# Patient Record
Sex: Male | Born: 1970 | Race: Black or African American | Hispanic: No | Marital: Married | Smoking: Never smoker
Health system: Southern US, Community
[De-identification: ages and names within clinical notes are randomized; demographics above are authoritative.]

## PROBLEM LIST (undated history)

## (undated) DIAGNOSIS — R42 Dizziness and giddiness: Secondary | ICD-10-CM

## (undated) DIAGNOSIS — E079 Disorder of thyroid, unspecified: Secondary | ICD-10-CM

## (undated) DIAGNOSIS — E0590 Thyrotoxicosis, unspecified without thyrotoxic crisis or storm: Secondary | ICD-10-CM

## (undated) DIAGNOSIS — I10 Essential (primary) hypertension: Secondary | ICD-10-CM

## (undated) HISTORY — DX: Disorder of thyroid, unspecified: E07.9

## (undated) HISTORY — DX: Essential (primary) hypertension: I10

## (undated) HISTORY — DX: Thyrotoxicosis, unspecified without thyrotoxic crisis or storm: E05.90

## (undated) HISTORY — DX: Dizziness and giddiness: R42

---

## 2001-09-20 ENCOUNTER — Encounter: Admission: RE | Admit: 2001-09-20 | Discharge: 2001-09-20 | Payer: Self-pay | Admitting: Family Medicine

## 2001-10-03 ENCOUNTER — Encounter: Admission: RE | Admit: 2001-10-03 | Discharge: 2001-10-03 | Payer: Self-pay | Admitting: Family Medicine

## 2002-06-25 ENCOUNTER — Encounter: Admission: RE | Admit: 2002-06-25 | Discharge: 2002-06-25 | Payer: Self-pay | Admitting: Family Medicine

## 2002-09-12 ENCOUNTER — Encounter: Admission: RE | Admit: 2002-09-12 | Discharge: 2002-09-12 | Payer: Self-pay | Admitting: Family Medicine

## 2003-02-20 ENCOUNTER — Encounter: Admission: RE | Admit: 2003-02-20 | Discharge: 2003-02-20 | Payer: Self-pay | Admitting: Family Medicine

## 2003-05-08 ENCOUNTER — Encounter: Admission: RE | Admit: 2003-05-08 | Discharge: 2003-05-08 | Payer: Self-pay | Admitting: Family Medicine

## 2004-09-25 ENCOUNTER — Ambulatory Visit: Payer: Self-pay | Admitting: Family Medicine

## 2006-09-21 ENCOUNTER — Encounter (INDEPENDENT_AMBULATORY_CARE_PROVIDER_SITE_OTHER): Payer: Self-pay | Admitting: *Deleted

## 2006-09-21 ENCOUNTER — Ambulatory Visit: Payer: Self-pay | Admitting: Family Medicine

## 2006-09-21 LAB — CONVERTED CEMR LAB: Rapid Strep: NEGATIVE

## 2006-11-08 ENCOUNTER — Ambulatory Visit: Payer: Self-pay | Admitting: Family Medicine

## 2006-11-10 ENCOUNTER — Ambulatory Visit: Payer: Self-pay | Admitting: Sports Medicine

## 2006-12-15 ENCOUNTER — Ambulatory Visit: Payer: Self-pay | Admitting: Family Medicine

## 2007-02-02 ENCOUNTER — Ambulatory Visit: Payer: Self-pay | Admitting: Family Medicine

## 2007-02-02 ENCOUNTER — Encounter: Payer: Self-pay | Admitting: Family Medicine

## 2007-02-02 DIAGNOSIS — E669 Obesity, unspecified: Secondary | ICD-10-CM

## 2007-02-02 LAB — CONVERTED CEMR LAB
Glucose, Urine, Semiquant: NEGATIVE
Potassium: 4.2 meq/L (ref 3.5–5.3)
Sodium: 140 meq/L (ref 135–145)

## 2007-02-03 ENCOUNTER — Encounter: Payer: Self-pay | Admitting: Family Medicine

## 2007-02-24 ENCOUNTER — Emergency Department (HOSPITAL_COMMUNITY): Admission: EM | Admit: 2007-02-24 | Discharge: 2007-02-24 | Payer: Self-pay | Admitting: Emergency Medicine

## 2007-02-28 ENCOUNTER — Ambulatory Visit: Payer: Self-pay | Admitting: Family Medicine

## 2007-05-30 ENCOUNTER — Ambulatory Visit: Payer: Self-pay | Admitting: Family Medicine

## 2007-05-30 ENCOUNTER — Encounter (INDEPENDENT_AMBULATORY_CARE_PROVIDER_SITE_OTHER): Payer: Self-pay | Admitting: Family Medicine

## 2007-05-30 DIAGNOSIS — I10 Essential (primary) hypertension: Secondary | ICD-10-CM

## 2007-05-30 LAB — CONVERTED CEMR LAB
CO2: 28 meq/L (ref 19–32)
Chloride: 103 meq/L (ref 96–112)
Glucose, Bld: 90 mg/dL (ref 70–99)
LDL Cholesterol: 156 mg/dL — ABNORMAL HIGH (ref 0–99)
Potassium: 4.8 meq/L (ref 3.5–5.3)
Sodium: 139 meq/L (ref 135–145)
Total CHOL/HDL Ratio: 4
VLDL: 20 mg/dL (ref 0–40)

## 2007-06-01 ENCOUNTER — Encounter (INDEPENDENT_AMBULATORY_CARE_PROVIDER_SITE_OTHER): Payer: Self-pay | Admitting: Family Medicine

## 2007-07-17 ENCOUNTER — Encounter: Payer: Self-pay | Admitting: *Deleted

## 2007-09-17 ENCOUNTER — Encounter (INDEPENDENT_AMBULATORY_CARE_PROVIDER_SITE_OTHER): Payer: Self-pay | Admitting: *Deleted

## 2008-05-31 ENCOUNTER — Encounter: Payer: Self-pay | Admitting: Sports Medicine

## 2008-05-31 ENCOUNTER — Ambulatory Visit: Payer: Self-pay | Admitting: Sports Medicine

## 2008-05-31 ENCOUNTER — Encounter (INDEPENDENT_AMBULATORY_CARE_PROVIDER_SITE_OTHER): Payer: Self-pay | Admitting: *Deleted

## 2008-06-03 ENCOUNTER — Encounter (INDEPENDENT_AMBULATORY_CARE_PROVIDER_SITE_OTHER): Payer: Self-pay | Admitting: *Deleted

## 2008-06-03 ENCOUNTER — Ambulatory Visit: Payer: Self-pay | Admitting: Family Medicine

## 2008-07-31 ENCOUNTER — Ambulatory Visit: Payer: Self-pay | Admitting: Sports Medicine

## 2009-04-09 ENCOUNTER — Ambulatory Visit: Payer: Self-pay | Admitting: Sports Medicine

## 2009-04-11 ENCOUNTER — Ambulatory Visit: Payer: Self-pay | Admitting: Family Medicine

## 2009-04-11 ENCOUNTER — Encounter: Payer: Self-pay | Admitting: Sports Medicine

## 2009-04-11 LAB — CONVERTED CEMR LAB
Albumin: 4.1 g/dL (ref 3.5–5.2)
Alkaline Phosphatase: 56 units/L (ref 39–117)
CO2: 28 meq/L (ref 19–32)
Glucose, Bld: 86 mg/dL (ref 70–99)
LDL Cholesterol: 150 mg/dL — ABNORMAL HIGH (ref 0–99)
MCHC: 33.9 g/dL (ref 30.0–36.0)
Potassium: 4.6 meq/L (ref 3.5–5.3)
RBC: 4.96 M/uL (ref 4.22–5.81)
Sodium: 139 meq/L (ref 135–145)
Total Protein: 6.9 g/dL (ref 6.0–8.3)
Triglycerides: 66 mg/dL (ref <150)
WBC: 5.7 10*3/uL (ref 4.0–10.5)

## 2009-10-09 ENCOUNTER — Ambulatory Visit: Payer: Self-pay | Admitting: Sports Medicine

## 2009-10-14 ENCOUNTER — Encounter: Payer: Self-pay | Admitting: Sports Medicine

## 2009-10-20 ENCOUNTER — Encounter (INDEPENDENT_AMBULATORY_CARE_PROVIDER_SITE_OTHER): Payer: Self-pay | Admitting: *Deleted

## 2009-10-20 ENCOUNTER — Ambulatory Visit: Payer: Self-pay | Admitting: Sports Medicine

## 2009-10-27 ENCOUNTER — Encounter (INDEPENDENT_AMBULATORY_CARE_PROVIDER_SITE_OTHER): Payer: Self-pay | Admitting: *Deleted

## 2009-10-30 ENCOUNTER — Ambulatory Visit: Payer: Self-pay | Admitting: Sports Medicine

## 2009-10-31 ENCOUNTER — Encounter: Payer: Self-pay | Admitting: Sports Medicine

## 2009-11-04 ENCOUNTER — Encounter: Payer: Self-pay | Admitting: Sports Medicine

## 2009-11-06 ENCOUNTER — Encounter: Payer: Self-pay | Admitting: Sports Medicine

## 2009-12-04 ENCOUNTER — Encounter (INDEPENDENT_AMBULATORY_CARE_PROVIDER_SITE_OTHER): Payer: Self-pay | Admitting: *Deleted

## 2010-06-01 ENCOUNTER — Encounter: Payer: Self-pay | Admitting: Family Medicine

## 2010-08-20 NOTE — Assessment & Plan Note (Signed)
Summary: 11:30- FINGER PAIN PER NEETON,MC   Vital Signs:  Patient profile:   40 year old male BP sitting:   157 / 97  Vitals Entered By: Lillia Pauls CMA (October 20, 2009 12:47 PM)  Primary Provider:  Tinnie Gens MD   History of Present Illness: Crecencio was playing football in [employer] on 4/3 grabbed [place] w ring finger left hand felt as if finger popped out of place pulled on PIP and this seemed to pop howver had persistent pain on volar surface of DIP of 4th finger and some over dorsal some bruising noted  seen at urgent center splinted xray neg concern for [place] finger and asked to dollow up here  Allergies: No Known Drug Allergies  Physical Exam  General:  Well-developed,well-nourished,in no acute distress; alert,appropriate and cooperative throughout examination Msk:  left 4th finger is swollen and DIP is in slight flexion tender on volar surface slt tender on dorsum pip not tender no rotation or angulation slt swelling over mid phalanx volar  MSK [place] dorsal area does not reveal a tear or fragment over dorsal DIP to suggest mallet finger Volar surface shows an intact flex profundis tendon attaches to distal phalanx but w some swelling and ? of partial tear small fx type fragment at medial side   Impression & Recommendations:  Problem # 1:  SPRAIN AND STRAIN OF INTERPHALANGEAL OF HAND (QMV-784.69)  concern for [place] finger but I thik he has some active flexion and scan is not suggestive   on scan I do not see a complete tear in fact there may be a small avulsion fx at DIP volar  keep splinted reck 1 wk  will discuss w ortho to be sure whether cons approach OK  Orders: [place] LIMITED ([id number])  Complete Medication List: 1)  Lisinopril-hydrochlorothiazide 20-12.5 Mg Tabs (Lisinopril-hydrochlorothiazide) .... 2 by mouth qd 2)  Diclofenac Sodium 75 Mg Tbec (Diclofenac sodium) .Marland Kitchen.. 1 by mouth two times a day prn 3)  Antivert 25 Mg Tabs (Meclizine hcl) .... One tab  by mouth three times a day

## 2010-08-20 NOTE — Miscellaneous (Signed)
  Clinical Lists Changes  Problems: Removed problem of SPRAIN AND STRAIN OF INTERPHALANGEAL OF HAND (ICD-842.13) Removed problem of TESTICULAR PAIN, LEFT (ICD-608.9) Removed problem of FATIGUE (ICD-780.79) Removed problem of BENIGN POSITIONAL VERTIGO (ICD-386.11) Removed problem of MUSCLE STRAIN, RIGHT PECTORALIS MUSCLE (ICD-848.8) Removed problem of WOUND OPEN, SCALP W/O COMPLICATION (ICD-873.0) Removed problem of EXAMINATION, ROUTINE MEDICAL (ICD-V70.0) Removed problem of ELEVATED BLOOD PRESSURE WITHOUT DIAGNOSIS OF HYPERTENSION (ICD-796.2) Removed problem of SYMPTOM, COUGH (ICD-786.2) Removed problem of FAMILY HISTORY DIABETES 1ST DEGREE RELATIVE (ICD-V18.0)

## 2010-08-20 NOTE — Letter (Signed)
Summary: Frank Vargas   Imported By: Marily Memos 10/31/2009 10:53:43  _____________________________________________________________________  External Attachment:    Type:   Image     Comment:   External Document

## 2010-08-20 NOTE — Letter (Signed)
Summary: Out of Work  [facility]  [address]   [place], [place] [address]   Phone: [phone]  Fax: [phone]    October 27, 2009   Employee:  FARREL GUIMOND     To Whom It May Concern:   For Medical reasons, please excuse the above named employee from work starting October 27, 2009 until December 22, 2009 or until notified sooner. If you need additional information, please feel free to contact our office.         Sincerely,    Sibyl Parr. Fields, M.D./Annelisa Ryback Christell Constant CMA

## 2010-08-20 NOTE — Assessment & Plan Note (Signed)
Summary: 1:30-GROIN CHECK,MC   Primary Provider:  Tinnie Gens MD   History of Present Illness: Developed left testicular pain 7 days ago he was running in loose shorts and did not wear support briefs that he usually wears no real swelling but tender to touch left testicle for 3 days pain now resolved  no DC denies STD risk or exposure  no strain of mm in area  does worry as father has hx of prostate cancer  Allergies: No Known Drug Allergies  Physical Exam  General:  Well-developed,well-nourished,in no acute distress; alert,appropriate and cooperative throughout examination Genitalia:  normal penis no lesions or dischargeno hydrocele, no varicocele, no scrotal masses, no testicular masses or atrophy, and no cutaneous lesions.   Msk:  norm Hip ROM no pain on testing of adductors and otehr mm   Impression & Recommendations:  Problem # 1:  TESTICULAR PAIN, LEFT (ICD-608.9) I suspect this arose from jogging without support and contusion  no sign of infection or masses  suggest support w jock over shorts when running  RTC if pain or any swelling recurs  Problem # 2:  HYPERTENSION, BENIGN (ICD-401.1)  His updated medication list for this problem includes:    Lisinopril-hydrochlorothiazide 20-12.5 Mg Tabs (Lisinopril-hydrochlorothiazide) .Marland Kitchen... 2 by mouth qd \ telerating med and level looks good today  Complete Medication List: 1)  Lisinopril-hydrochlorothiazide 20-12.5 Mg Tabs (Lisinopril-hydrochlorothiazide) .... 2 by mouth qd 2)  Diclofenac Sodium 75 Mg Tbec (Diclofenac sodium) .Marland Kitchen.. 1 by mouth two times a day prn 3)  Antivert 25 Mg Tabs (Meclizine hcl) .... One tab by mouth three times a day

## 2010-08-20 NOTE — Consult Note (Signed)
Summary: [facility]  [facility]   Imported By: Marily Memos 11/25/2009 10:11:21  _____________________________________________________________________  External Attachment:    Type:   Image     Comment:   External Document

## 2010-08-20 NOTE — Medication Information (Signed)
Summary: [facility]   Imported By: Marily Memos 10/17/2009 10:42:05  _____________________________________________________________________  External Attachment:    Type:   Image     Comment:   External Document

## 2010-08-20 NOTE — Letter (Signed)
Summary: Out of Work  [facility]  [address]   [place], [place] [address]   Phone: [phone]  Fax: [phone]    October 20, 2009   Employee:  MARTAVION COUPER    To Whom It May Concern:   For Medical reasons, please excuse the above named employee from lifting any objects greater than 10lbs until further evaluated in one week. If you need additional information, please feel free to contact our office.         Sincerely,    Sibyl Parr. Fields, M.D./Nayellie Sanseverino Christell Constant CMA

## 2010-08-20 NOTE — Letter (Signed)
Summary: *Referral Letter  [facility]  [address]   [place], [place] [address]   Phone: [phone]  Fax: [phone]    11/04/2009 Dominica Severin, MD [facility] Fax [phone]   Dear Dr Amanda Pea:  Thank you in advance for agreeing to see my patient:  Frank Vargas [address] [place], [place]  [address]  Phone: [phone]  Reason for Referral: We are unclear as to the nature of this DIP injury.  Mechanism suggested [place] finger.  MSK [place] suggested that tendon intact but ? of small fragment within the DIP.  Procedures Requested: Your diagnostic expertise and care  Current Medical Problems: 1)  SPRAIN AND STRAIN OF INTERPHALANGEAL OF HAND (ICD-842.13) 2)  TESTICULAR PAIN, LEFT (ICD-608.9) 3)  FATIGUE (ICD-780.79) 4)  BENIGN POSITIONAL VERTIGO (ICD-386.11) 5)  MUSCLE STRAIN, RIGHT PECTORALIS MUSCLE (ICD-848.8) 6)  HYPERTENSION, BENIGN (ICD-401.1) 7)  WOUND OPEN, SCALP W/O COMPLICATION (ICD-873.0) 8)  OBESITY (ICD-278.00) 9)  EXAMINATION, ROUTINE MEDICAL (ICD-V70.0) 10)  FAMILY HISTORY DIABETES 1ST DEGREE RELATIVE (ICD-V18.0) 11)  ELEVATED BLOOD PRESSURE WITHOUT DIAGNOSIS OF HYPERTENSION (ICD-796.2) 12)  SYMPTOM, COUGH (ICD-786.2)   Current Medications: 1)  LISINOPRIL-HYDROCHLOROTHIAZIDE 20-12.5 MG TABS (LISINOPRIL-HYDROCHLOROTHIAZIDE) 2 by mouth qd 2)  DICLOFENAC SODIUM 75 MG TBEC (DICLOFENAC SODIUM) 1 by mouth two times a day prn 3)  ANTIVERT 25 MG TABS (MECLIZINE HCL) one tab by mouth three times a day  Past Medical History: 1)  Elevated blood pressure in past- improved with exercise and diet.   2)   Dx HTN 05/2008   Thank you again for agreeing to see our patient; please contact us if you have any further questions or need additional information.  Sincerely,  Vincent Gros MD

## 2010-08-20 NOTE — Assessment & Plan Note (Signed)
Summary: CHECK FINGER/MJD   Vital Signs:  Patient profile:   40 year old male BP sitting:   122 / 86  Vitals Entered By: Lillia Pauls CMA (October 30, 2009 2:40 PM)  Primary Provider:  Tinnie Gens MD   History of Present Illness: Pt presents for follow-up of left 4th finger injury that he hurt while playing flag football. He had gotten his finger wrapped up on an opponent's [place] while trying to get past him. He was seen previously and placed in a splint with his finger in extension since his physical exam was actually concerning for a small fracture along the volar surface of his finger (possible mallet finger). He had an ultrasound previously that showed that his tendons were intact but he had a small bony abnormality on the volar surface of the distal phalangee at the DIP. Since his last visit, he has kept his finger in the splint in extension. He does not need to take regular pain medications. He has not seen much of an improvement with ROM but again, he has not been attempting ROM thus far. He is right hand dominant.   Allergies: No Known Drug Allergies  Physical Exam  General:  alert and well-developed.   Head:  normocephalic and atraumatic.   Neck:  supple.   Lungs:  normal respiratory effort.   Msk:  Left Hand: Mild swelling of 4th DIP Able to make a fist but not able to close down 4th finger. No obvious rotational defects noted. + TTP over the lateral surfaces and the volar surface of the DIP joint Full ROM of MCP and PIP joints Unable to flex actively at DIP Finger pulls back into extension as soon as the DIP is flexed and gently released Unable to flex DIP actively 4/5 grip strength, 4th finger abduction and adduction  Right Hand: Normal inspection Able to make a normal fist without rotation defects Full ROM of all fingers and wrist No TTP throughout the hand 5/5 grip strength and finger strength Additional Exam:  U/S of the 4th left finger shows some slight  anormalities of the extensor tendon just proximal to its attachment although fibers are appreciate at the attachment. Some edema and likely a partial tear of the extensor tendon. Bony abnormality with possible chip on the volar surface of the DIP joint. The tendon appears to be partially intact again but imaging this time is more difficult. Images saved for documentation.    Impression & Recommendations:  Problem # 1:  SPRAIN AND STRAIN OF INTERPHALANGEAL OF HAND (ICD-842.13) Assessment Unchanged His finger is concerning for a possible volar surface DIP injury - tenderness is more consistent w [place] finger type injury but DIP position with a mallet type injury - in this regard I wonder if there is a small intrarticular fragment at DIP 1. Will refer to a hand surgeon for evaluation 2. Will continue to keep him out of work as he works for [employer] and will be unable to lift normal packages, etc. The patient states that they do not have light duty available for him. 3. Return as needed for other concerns 4. Continue splint at all times with his 4th finger in extension  Complete Medication List: 1)  Lisinopril-hydrochlorothiazide 20-12.5 Mg Tabs (Lisinopril-hydrochlorothiazide) .... 2 by mouth qd 2)  Diclofenac Sodium 75 Mg Tbec (Diclofenac sodium) .Marland Kitchen.. 1 by mouth two times a day prn 3)  Antivert 25 Mg Tabs (Meclizine hcl) .... One tab by mouth three times a day  Patient  Instructions: 1)  APPT WITH DR GRAMIG IS ON 11-06-09 AT 10:30AM. [address] [facility]. [phone]

## 2010-08-20 NOTE — Miscellaneous (Signed)
Summary: SECOND OPINION FOR ORTH  APPT IS TO SEE DR. DALDORF ON JUNE 1ST AT 3:30 FOR SECOND OPINION  ON L DIP [place] FINGER INJURY. PT INFORMED

## 2010-08-25 ENCOUNTER — Encounter: Payer: Self-pay | Admitting: *Deleted

## 2010-12-25 ENCOUNTER — Other Ambulatory Visit: Payer: Self-pay | Admitting: Sports Medicine

## 2011-01-04 ENCOUNTER — Encounter: Payer: Self-pay | Admitting: Family Medicine

## 2011-01-04 ENCOUNTER — Ambulatory Visit (INDEPENDENT_AMBULATORY_CARE_PROVIDER_SITE_OTHER): Payer: BC Managed Care – PPO | Admitting: Family Medicine

## 2011-01-04 VITALS — BP 116/80

## 2011-01-04 DIAGNOSIS — M67919 Unspecified disorder of synovium and tendon, unspecified shoulder: Secondary | ICD-10-CM

## 2011-01-04 DIAGNOSIS — M75101 Unspecified rotator cuff tear or rupture of right shoulder, not specified as traumatic: Secondary | ICD-10-CM

## 2011-01-05 ENCOUNTER — Encounter: Payer: Self-pay | Admitting: Family Medicine

## 2011-01-05 DIAGNOSIS — M75101 Unspecified rotator cuff tear or rupture of right shoulder, not specified as traumatic: Secondary | ICD-10-CM | POA: Insufficient documentation

## 2011-01-05 NOTE — Progress Notes (Signed)
  Subjective:    Patient ID: Frank Vargas, male    DOB: 09-Sep-1970, 40 y.o.   MRN: [id number]  HPI  Bilateral R>L shoulder pain for 6 months to a year. Works at [employer] and does a lot of heavy lifting. About a year ago he was also doing some weight lifting and developed left clavicle / anterior shoulder pain acutely. He had also had insidious onset of right shoulder pain and tha tis what is  bothering him mostly now. He has stopped lifting weights due to the pain.   Review of Systems Denies numbness in his hands. Pertinent review of systems: negative for fever or unusual weight change.     Objective:   Physical Exam    MSK: FROm B shoulders. rotator cuff strength is intact in all planes on each side. He has pain with resisted internal rotation (lift off test) and with impingement testing.  Clavicle: right and left with no TTP and no defect. AC joints and [place] joints stable and non tender to palpation.  MSK [place]: Right rotator cuff muscles intact. Some calcification in mid substance or right supraspinatus.  Left AC joint mild arthropathy. Right AC joint mild arthropathy.  INJECTION: Patient was given informed consent, signed copy in the chart. Appropriate time out was taken. Area prepped and draped in usual sterile fashion. 1 cc of kenalog plus  4 cc of lidocaine was injected into the right subacromial bursa using a(n) posterior approach. The patient tolerated the procedure well. There were no complications. Post procedure instructions were given.   Assessment & Plan:  Supraspinatus tendinopathy--likely previous partial tear that has healed. CSI injection, rotator cuff program. Unclear what the left shoulder pain / clavicular pain was--rtc prn.

## 2011-01-13 ENCOUNTER — Other Ambulatory Visit: Payer: Self-pay | Admitting: *Deleted

## 2011-01-13 MED ORDER — IBUPROFEN 800 MG PO TABS: 800.0000 mg | ORAL_TABLET | Freq: Three times a day (TID) | ORAL | Status: AC | PRN

## 2011-09-29 ENCOUNTER — Encounter: Payer: Self-pay | Admitting: Sports Medicine

## 2011-09-29 ENCOUNTER — Ambulatory Visit (INDEPENDENT_AMBULATORY_CARE_PROVIDER_SITE_OTHER): Payer: BC Managed Care – PPO | Admitting: Sports Medicine

## 2011-09-29 DIAGNOSIS — I10 Essential (primary) hypertension: Secondary | ICD-10-CM

## 2011-09-29 DIAGNOSIS — E669 Obesity, unspecified: Secondary | ICD-10-CM

## 2011-09-29 DIAGNOSIS — M75101 Unspecified rotator cuff tear or rupture of right shoulder, not specified as traumatic: Secondary | ICD-10-CM

## 2011-09-29 DIAGNOSIS — M719 Bursopathy, unspecified: Secondary | ICD-10-CM

## 2011-09-29 DIAGNOSIS — Z0000 Encounter for general adult medical examination without abnormal findings: Secondary | ICD-10-CM

## 2011-09-29 NOTE — Patient Instructions (Signed)
Please have labs drawn at [facility]  Try following the DASH diet  We will do a stress test, and prostate exam when you turn 42  Keep up your physical activity as much as possible  Please follow up for another physical at age 41  Thank you for seeing [place] today!

## 2011-09-29 NOTE — Assessment & Plan Note (Signed)
This appears resolved and we will recheck if symptoms recur

## 2011-09-29 NOTE — Progress Notes (Signed)
  Subjective:    Patient ID: Frank Vargas, male    DOB: 1970/07/23, 41 y.o.   MRN: [id number]  HPI  Pt presents to clinic for CPE. Reports no problems with BP or BP med Exercises 1 day per week- run/ lift weights. Rt shoulder aches at times, but improves with advil 800 mg No problems with dizziness now. Not aware of any medical problems currently. No chest pain or shortness of breath Notices constipation frequently, no abdominal pain  Works for [employer], and [employer] in the evenings Feels fatigued from long working hours.   Family Hx: Mother- diabetic insulin dependent, breast CA Father- Htn, prostate CA at age 29 Has 3 siblings- 2 sisters and 1 brother 1 brother and 1 sister- diabetic All siblings- HTN 3 uncles diabetic Grandmother- still living 20 yrs old  2 Grandfathers- with strokes 1 grandmother - stroke      Review of Systems No shortness of breath, no chest pain, no abdominal pain, no blood in stools, no dizziness, he has a few moles but none of them have changed in size or appearance    Objective:   Physical Exam  Has fine tremor with hands extended No cog wheeling No intention tremor bilat  Ears- TMs normal Throat- normal, tonsils normal Eyes- normal Cardiovascular- no carotid bruits No thyromegaly Heart- normal RR Lungs- clear   Skin - mole rt posterior shoulder, above rt breast, lt anterior - hypopigmented and look benign Shoulders- empty can and hawkins neg, no clunk bilat Abdomen- no hepatosplenomegaly  Hips- normal ROM Knee- normal ROM            Assessment & Plan:

## 2011-09-29 NOTE — Assessment & Plan Note (Signed)
Shoot for a goal weight below 190 pounds  Note that he is muscular and can run a somewhat higher BMI

## 2011-09-29 NOTE — Assessment & Plan Note (Addendum)
I would like him to try to exercise more regularly when his job hours or less so that he has time  He can continue running and generally does this 3-4 times a week during the summer  Continue to work on losing weight and we gave him another copy of the DASH diet today  Repeat physical and preventive screening at age 41  Labs ordered today

## 2011-09-29 NOTE — Assessment & Plan Note (Signed)
Continue on his current medication  His last cholesterol and LDL levels were higher than I would like and we will recheck these today along with his renal function and metabolic tests

## 2011-09-30 ENCOUNTER — Other Ambulatory Visit: Payer: BC Managed Care – PPO

## 2011-09-30 DIAGNOSIS — I10 Essential (primary) hypertension: Secondary | ICD-10-CM

## 2011-09-30 LAB — COMPREHENSIVE METABOLIC PANEL
ALT: 40 U/L (ref 0–53)
Albumin: 4.2 g/dL (ref 3.5–5.2)
CO2: 29 mEq/L (ref 19–32)
Potassium: 4.2 mEq/L (ref 3.5–5.3)
Sodium: 139 mEq/L (ref 135–145)
Total Bilirubin: 0.6 mg/dL (ref 0.3–1.2)
Total Protein: 6.8 g/dL (ref 6.0–8.3)

## 2011-09-30 LAB — CBC
MCH: 28.9 pg (ref 26.0–34.0)
MCV: 88 fL (ref 78.0–100.0)
Platelets: 282 10*3/uL (ref 150–400)
RDW: 14.6 % (ref 11.5–15.5)
WBC: 5.7 10*3/uL (ref 4.0–10.5)

## 2011-09-30 NOTE — Progress Notes (Signed)
CMP,FLP AND CBC DONE TODAY Frank Vargas 

## 2011-10-01 LAB — LIPID PANEL
HDL: 50 mg/dL (ref >39)
LDL Cholesterol: 143 mg/dL — ABNORMAL HIGH (ref 0–99)

## 2011-11-03 ENCOUNTER — Other Ambulatory Visit: Payer: Self-pay | Admitting: *Deleted

## 2011-11-03 MED ORDER — LISINOPRIL-HYDROCHLOROTHIAZIDE 20-12.5 MG PO TABS: 2.0000 | ORAL_TABLET | Freq: Every day | ORAL | Status: DC

## 2012-01-23 ENCOUNTER — Other Ambulatory Visit: Payer: Self-pay | Admitting: Sports Medicine

## 2012-02-22 ENCOUNTER — Other Ambulatory Visit: Payer: Self-pay | Admitting: *Deleted

## 2012-02-22 MED ORDER — IBUPROFEN 800 MG PO TABS: 800.0000 mg | ORAL_TABLET | Freq: Three times a day (TID) | ORAL | Status: DC | PRN

## 2012-03-22 ENCOUNTER — Other Ambulatory Visit: Payer: Self-pay | Admitting: *Deleted

## 2012-03-22 MED ORDER — LISINOPRIL-HYDROCHLOROTHIAZIDE 20-12.5 MG PO TABS: 2.0000 | ORAL_TABLET | Freq: Every day | ORAL | Status: DC

## 2012-03-30 ENCOUNTER — Other Ambulatory Visit: Payer: Self-pay | Admitting: *Deleted

## 2012-03-30 MED ORDER — LISINOPRIL-HYDROCHLOROTHIAZIDE 20-12.5 MG PO TABS: 2.0000 | ORAL_TABLET | Freq: Every day | ORAL | Status: DC

## 2012-05-17 ENCOUNTER — Ambulatory Visit (INDEPENDENT_AMBULATORY_CARE_PROVIDER_SITE_OTHER): Payer: BC Managed Care – PPO | Admitting: Sports Medicine

## 2012-05-17 VITALS — BP 116/70 | Ht 67.0 in | Wt 200.0 lb

## 2012-05-17 DIAGNOSIS — H8110 Benign paroxysmal vertigo, unspecified ear: Secondary | ICD-10-CM

## 2012-05-17 NOTE — Patient Instructions (Addendum)
Benign Positional Vertigo  Vertigo means you feel like you or your surroundings are moving when they are not. Benign positional vertigo is the most common form of vertigo. Benign means that the cause of your condition is not serious. Benign positional vertigo is more common in older adults.  CAUSES   Benign positional vertigo is the result of an upset in the labyrinth system. This is an area in the middle ear that helps control your balance. This may be caused by a viral infection, head injury, or repetitive motion. However, often no specific cause is found.  SYMPTOMS   Symptoms of benign positional vertigo occur when you move your head or eyes in different directions. Some of the symptoms may include:  · Loss of balance and falls.  · Vomiting.  · Blurred vision.  · Dizziness.  · Nausea.  · Involuntary eye movements (nystagmus).  DIAGNOSIS   Benign positional vertigo is usually diagnosed by physical exam. If the specific cause of your benign positional vertigo is unknown, your caregiver may perform imaging tests, such as magnetic resonance imaging (MRI) or computed tomography (CT).  TREATMENT   Your caregiver may recommend movements or procedures to correct the benign positional vertigo. Medicines such as meclizine, benzodiazepines, and medicines for nausea may be used to treat your symptoms. In rare cases, if your symptoms are caused by certain conditions that affect the inner ear, you may need surgery.  HOME CARE INSTRUCTIONS   · Follow your caregiver's instructions.  · Move slowly. Do not make sudden body or head movements.  · Avoid driving.  · Avoid operating heavy machinery.  · Avoid performing any tasks that would be dangerous to you or others during a vertigo episode.  · Drink enough fluids to keep your urine clear or pale yellow.  SEEK IMMEDIATE MEDICAL CARE IF:   · You develop problems with walking, weakness, numbness, or using your arms, hands, or legs.  · You have difficulty speaking.  · You develop  severe headaches.  · Your nausea or vomiting continues or gets worse.  · You develop visual changes.  · Your family or friends notice any behavioral changes.  · Your condition gets worse.  · You have a fever.  · You develop a stiff neck or sensitivity to light.  MAKE SURE YOU:   · Understand these instructions.  · Will watch your condition.  · Will get help right away if you are not doing well or get worse.  Document Released: 04/12/2006 Document Revised: 09/27/2011 Document Reviewed: 03/25/2011  ExitCare® Patient Information ©2013 [employer].

## 2012-05-17 NOTE — Progress Notes (Signed)
Subjective: Patient is a 41 year old male with a past medical history significant for hypertension coming in with dizziness. Patient states that this started last night. Patient states that he was attempting to try to follow sleep in the room started spinning. Patient denies any new medications, vitamins, or recent illnesses. Patient does state that he's had some worsening seasonal allergies though. Patient denies any headache, decreased vision or any numbness or weakness in the extremities. Patient has checked his blood pressure which has been normal. He denies any fevers chills or any abnormal weight loss.  Patient has a past medical history of this occurring last year and seemed to go away after 12 hours. This time though this is lasted much longer and he became concerned. Patient has been able to ambulate and was even able to drive but continues to feel that the room is spinning mildly.  Review of systems as stated above in history of present illness  Past medical surgical family history reviewed without any changes  Physical exam BP 116/70  Ht 5\' 7"  (1.702 m)  Wt 200 lb (90.719 kg)  BMI 31.32 kg/m2 General appearance: alert, cooperative and appears stated age Head: Normocephalic, without obvious abnormality, atraumatic Eyes: conjunctivae/corneas clear. PERRL, EOM's intact. Fundi benign. Ears: abnormal TM right ear - air-fluid level and abnormal TM left ear - air-fluid level Nose: turbinates swollen, edematous, no sinus tenderness, no polyps, no crusting or bleeding points Throat: abnormal findings: mild oropharyngeal erythema Neck: no adenopathy, no JVD, supple, symmetrical, trachea midline and thyroid not enlarged, symmetric, no tenderness/mass/nodules Lungs: clear to auscultation bilaterally Heart: regular rate and rhythm, S1, S2 normal, no murmur, click, rub or gallop Extremities: extremities normal, atraumatic, no cyanosis or edema Pulses: 2+ and symmetric Lymph nodes: Cervical,  supraclavicular, and axillary nodes normal. Neurologic: Alert and oriented X 3, normal strength and tone. Normal symmetric reflexes. Normal coordination and gait Cranial nerves: normal Sensory: normal Motor: grossly normal Reflexes: normal 2+ and symmetric Coordination: Dix-Hallpike test positive on the left Gait: Normal Patient did improve with the Epley maneuver.

## 2012-05-17 NOTE — Assessment & Plan Note (Addendum)
Patient does have benign positional vertigo that likely was exacerbated by his worsening seasonal allergies. Patient was given handout and home exercises to do for preventative therapy as well as treatment. Patient was taught the Epley's maneuver today. Patient will return as needed. Patient was given handout different red flags and when to seek medical attention. Patient did not have any red flags her signs of neurovascular compromise.

## 2012-12-07 ENCOUNTER — Other Ambulatory Visit: Payer: Self-pay | Admitting: *Deleted

## 2012-12-07 MED ORDER — LISINOPRIL-HYDROCHLOROTHIAZIDE 20-12.5 MG PO TABS: 2.0000 | ORAL_TABLET | Freq: Every day | ORAL | Status: DC

## 2013-02-26 ENCOUNTER — Other Ambulatory Visit: Payer: Self-pay | Admitting: *Deleted

## 2013-02-26 DIAGNOSIS — I10 Essential (primary) hypertension: Secondary | ICD-10-CM

## 2013-02-26 MED ORDER — LISINOPRIL-HYDROCHLOROTHIAZIDE 20-12.5 MG PO TABS: 2.0000 | ORAL_TABLET | Freq: Every day | ORAL | Status: DC

## 2013-03-30 ENCOUNTER — Other Ambulatory Visit: Payer: Self-pay | Admitting: *Deleted

## 2013-03-30 ENCOUNTER — Other Ambulatory Visit: Payer: Self-pay | Admitting: Sports Medicine

## 2013-06-28 ENCOUNTER — Ambulatory Visit (INDEPENDENT_AMBULATORY_CARE_PROVIDER_SITE_OTHER): Payer: BC Managed Care – PPO | Admitting: Family Medicine

## 2013-06-28 ENCOUNTER — Encounter: Payer: Self-pay | Admitting: Family Medicine

## 2013-06-28 VITALS — BP 140/81 | HR 59 | Temp 98.1°F | Wt 207.0 lb

## 2013-06-28 DIAGNOSIS — H8110 Benign paroxysmal vertigo, unspecified ear: Secondary | ICD-10-CM

## 2013-06-28 DIAGNOSIS — Z8249 Family history of ischemic heart disease and other diseases of the circulatory system: Secondary | ICD-10-CM

## 2013-06-28 DIAGNOSIS — Z8042 Family history of malignant neoplasm of prostate: Secondary | ICD-10-CM | POA: Insufficient documentation

## 2013-06-28 DIAGNOSIS — I10 Essential (primary) hypertension: Secondary | ICD-10-CM

## 2013-06-28 MED ORDER — MECLIZINE HCL 25 MG PO TABS: 25.0000 mg | ORAL_TABLET | Freq: Three times a day (TID) | ORAL | Status: DC

## 2013-06-28 MED ORDER — LISINOPRIL-HYDROCHLOROTHIAZIDE 20-12.5 MG PO TABS: 1.0000 | ORAL_TABLET | Freq: Every day | ORAL | Status: DC

## 2013-06-28 NOTE — Patient Instructions (Addendum)
Hypertension As your heart beats, it forces blood through your arteries. This force is your blood pressure. If the pressure is too high, it is called hypertension (HTN) or high blood pressure. HTN is dangerous because you may have it and not know it. High blood pressure may mean that your heart has to work harder to pump blood. Your arteries may be narrow or stiff. The extra work puts you at risk for heart disease, stroke, and other problems.  Blood pressure consists of two numbers, a higher number over a lower, 110/72, for example. It is stated as "110 over 72." The ideal is below 120 for the top number (systolic) and under 80 for the bottom (diastolic). Write down your blood pressure today. You should pay close attention to your blood pressure if you have certain conditions such as:  Heart failure.  Prior heart attack.  Diabetes  Chronic kidney disease.  Prior stroke.  Multiple risk factors for heart disease. To see if you have HTN, your blood pressure should be measured while you are seated with your arm held at the level of the heart. It should be measured at least twice. A one-time elevated blood pressure reading (especially in the Emergency Department) does not mean that you need treatment. There may be conditions in which the blood pressure is different between your right and left arms. It is important to see your caregiver soon for a recheck. Most people have essential hypertension which means that there is not a specific cause. This type of high blood pressure may be lowered by changing lifestyle factors such as:  Stress.  Smoking.  Lack of exercise.  Excessive weight.  Drug/tobacco/alcohol use.  Eating less salt. Most people do not have symptoms from high blood pressure until it has caused damage to the body. Effective treatment can often prevent, delay or reduce that damage. TREATMENT  When a cause has been identified, treatment for high blood pressure is directed at the  cause. There are a large number of medications to treat HTN. These fall into several categories, and your caregiver will help you select the medicines that are best for you. Medications may have side effects. You should review side effects with your caregiver. If your blood pressure stays high after you have made lifestyle changes or started on medicines,   Your medication(s) may need to be changed.  Other problems may need to be addressed.  Be certain you understand your prescriptions, and know how and when to take your medicine.  Be sure to follow up with your caregiver within the time frame advised (usually within two weeks) to have your blood pressure rechecked and to review your medications.  If you are taking more than one medicine to lower your blood pressure, make sure you know how and at what times they should be taken. Taking two medicines at the same time can result in blood pressure that is too low. SEEK IMMEDIATE MEDICAL CARE IF:  You develop a severe headache, blurred or changing vision, or confusion.  You have unusual weakness or numbness, or a faint feeling.  You have severe chest or abdominal pain, vomiting, or breathing problems. MAKE SURE YOU:   Understand these instructions.  Will watch your condition.  Will get help right away if you are not doing well or get worse. Document Released: 07/05/2005 Document Revised: 09/27/2011 Document Reviewed: 02/23/2008 [facility] Patient Information 2014 [place], [place]. Preventive Care for Adults, Male A healthy lifestyle and preventive care can promote health and wellness.  Preventive health guidelines for men include the following key practices:  A routine yearly physical is a good way to check with your caregiver about your health and preventative screening. It is a chance to share any concerns and updates on your health, and to receive a thorough exam.  Visit your dentist for a routine exam and preventative care every 6  months. Brush your teeth twice a day and floss once a day. Good oral hygiene prevents tooth decay and gum disease.  The frequency of eye exams is based on your age, health, family medical history, use of contact lenses, and other factors. Follow your caregiver's recommendations for frequency of eye exams.  Eat a healthy diet. Foods like vegetables, fruits, whole grains, low-fat dairy products, and lean protein foods contain the nutrients you need without too many calories. Decrease your intake of foods high in solid fats, added sugars, and salt. Eat the right amount of calories for you.Get information about a proper diet from your caregiver, if necessary.  Regular physical exercise is one of the most important things you can do for your health. Most adults should get at least 150 minutes of moderate-intensity exercise (any activity that increases your heart rate and causes you to sweat) each week. In addition, most adults need muscle-strengthening exercises on 2 or more days a week.  Maintain a healthy weight. The body mass index (BMI) is a screening tool to identify possible weight problems. It provides an estimate of body fat based on height and weight. Your caregiver can help determine your BMI, and can help you achieve or maintain a healthy weight.For adults 20 years and older:  A BMI below 18.5 is considered underweight.  A BMI of 18.5 to 24.9 is normal.  A BMI of 25 to 29.9 is considered overweight.  A BMI of 30 and above is considered obese.  Maintain normal blood lipids and cholesterol levels by exercising and minimizing your intake of saturated fat. Eat a balanced diet with plenty of fruit and vegetables. Blood tests for lipids and cholesterol should begin at age 8 and be repeated every 5 years. If your lipid or cholesterol levels are high, you are over 50, or you are a high risk for heart disease, you may need your cholesterol levels checked more frequently.Ongoing high lipid and  cholesterol levels should be treated with medicines if diet and exercise are not effective.  If you smoke, find out from your caregiver how to quit. If you do not use tobacco, do not start.  Lung cancer screening is recommended for adults aged 51 80 years who are at high risk for developing lung cancer because of a history of smoking. Yearly low-dose computed tomography (CT) is recommended for people who have at least a 30-pack-year history of smoking and are a current smoker or have quit within the past 15 years. A pack year of smoking is smoking an average of 1 pack of cigarettes a day for 1 year (for example: 1 pack a day for 30 years or 2 packs a day for 15 years). Yearly screening should continue until the smoker has stopped smoking for at least 15 years. Yearly screening should also be stopped for people who develop a health problem that would prevent them from having lung cancer treatment.  If you choose to drink alcohol, do not exceed 2 drinks per day. One drink is considered to be 12 ounces (355 mL) of beer, 5 ounces (148 mL) of wine, or 1.5 ounces (44 mL)  of liquor.  Avoid use of street drugs. Do not share needles with anyone. Ask for help if you need support or instructions about stopping the use of drugs.  High blood pressure causes heart disease and increases the risk of stroke. Your blood pressure should be checked at least every 1 to 2 years. Ongoing high blood pressure should be treated with medicines, if weight loss and exercise are not effective.  If you are 25 to 40 years old, ask your caregiver if you should take aspirin to prevent heart disease.  Diabetes screening involves taking a blood sample to check your fasting blood sugar level. This should be done once every 3 years, after age 93, if you are within normal weight and without risk factors for diabetes. Testing should be considered at a younger age or be carried out more frequently if you are overweight and have at least 1  risk factor for diabetes.  Colorectal cancer can be detected and often prevented. Most routine colorectal cancer screening begins at the age of 64 and continues through age 43. However, your caregiver may recommend screening at an earlier age if you have risk factors for colon cancer. On a yearly basis, your caregiver may provide home test kits to check for hidden blood in the stool. Use of a small camera at the end of a tube, to directly examine the colon (sigmoidoscopy or colonoscopy), can detect the earliest forms of colorectal cancer. Talk to your caregiver about this at age 51, when routine screening begins. Direct examination of the colon should be repeated every 5 to 10 years through age 8, unless early forms of pre-cancerous polyps or small growths are found.  Hepatitis C blood testing is recommended for all people born from [birth year] through 1965 and any individual with known risks for hepatitis C.  Practice safe sex. Use condoms and avoid high-risk sexual practices to reduce the spread of sexually transmitted infections (STIs). STIs include gonorrhea, chlamydia, syphilis, trichomonas, herpes, HPV, and human immunodeficiency virus (HIV). Herpes, HIV, and HPV are viral illnesses that have no cure. They can result in disability, cancer, and death.  A one-time screening for abdominal aortic aneurysm (AAA) and surgical repair of large AAAs by sound wave imaging (ultrasonography) is recommended for ages 37 to 45 years who are current or former smokers.  Healthy men should no longer receive prostate-specific antigen (PSA) blood tests as part of routine cancer screening. Consult with your caregiver about prostate cancer screening.  Testicular cancer screening is not recommended for adult males who have no symptoms. Screening includes self-exam, caregiver exam, and other screening tests. Consult with your caregiver about any symptoms you have or any concerns you have about testicular cancer.  Use  sunscreen. Apply sunscreen liberally and repeatedly throughout the day. You should seek shade when your shadow is shorter than you. Protect yourself by wearing long sleeves, pants, a wide-brimmed hat, and sunglasses year round, whenever you are outdoors.  Once a month, do a whole body skin exam, using a mirror to look at the skin on your back. Notify your caregiver of new moles, moles that have irregular borders, moles that are larger than a pencil eraser, or moles that have changed in shape or color.  Stay current with required immunizations.  Influenza vaccine. All adults should be immunized every year.  Tetanus, diphtheria, and acellular pertussis (Td, Tdap) vaccine. An adult who has not previously received Tdap or who does not know his vaccine status should receive 1 dose of  Tdap. This initial dose should be followed by tetanus and diphtheria toxoids (Td) booster doses every 10 years. Adults with an unknown or incomplete history of completing a 3-dose immunization series with Td-containing vaccines should begin or complete a primary immunization series including a Tdap dose. Adults should receive a Td booster every 10 years.  Varicella vaccine. An adult without evidence of immunity to varicella should receive 2 doses or a second dose if he has previously received 1 dose.  Human papillomavirus (HPV) vaccine. Males aged 79 21 years who have not received the vaccine previously should receive the 3-dose series. Males aged 30 26 years may be immunized. Immunization is recommended through the age of 44 years for any male who has sex with males and did not get any or all doses earlier. Immunization is recommended for any person with an immunocompromised condition through the age of 26 years if he did not get any or all doses earlier. During the 3-dose series, the second dose should be obtained 4 8 weeks after the first dose. The third dose should be obtained 24 weeks after the first dose and 16 weeks after  the second dose.  Zoster vaccine. One dose is recommended for adults aged 37 years or older unless certain conditions are present.  Measles, mumps, and rubella (MMR) vaccine. Adults born before 2 generally are considered immune to measles and mumps. Adults born in [birth year] or later should have 1 or more doses of MMR vaccine unless there is a contraindication to the vaccine or there is laboratory evidence of immunity to each of the three diseases. A routine second dose of MMR vaccine should be obtained at least 28 days after the first dose for students attending postsecondary schools, health care workers, or international travelers. People who received inactivated measles vaccine or an unknown type of measles vaccine during 1963 1967 should receive 2 doses of MMR vaccine. People who received inactivated mumps vaccine or an unknown type of mumps vaccine before 1979 and are at high risk for mumps infection should consider immunization with 2 doses of MMR vaccine. Unvaccinated health care workers born before 60 who lack laboratory evidence of measles, mumps, or rubella immunity or laboratory confirmation of disease should consider measles and mumps immunization with 2 doses of MMR vaccine or rubella immunization with 1 dose of MMR vaccine.  Pneumococcal 13-valent conjugate (PCV13) vaccine. When indicated, a person who is uncertain of his immunization history and has no record of immunization should receive the PCV13 vaccine. An adult aged 35 years or older who has certain medical conditions and has not been previously immunized should receive 1 dose of PCV13 vaccine. This PCV13 should be followed with a dose of pneumococcal polysaccharide (PPSV23) vaccine. The PPSV23 vaccine dose should be obtained at least 8 weeks after the dose of PCV13 vaccine. An adult aged 74 years or older who has certain medical conditions and previously received 1 or more doses of PPSV23 vaccine should receive 1 dose of PCV13. The PCV13  vaccine dose should be obtained 1 or more years after the last PPSV23 vaccine dose.  Pneumococcal polysaccharide (PPSV23) vaccine. When PCV13 is also indicated, PCV13 should be obtained first. All adults aged 27 years and older should be immunized. An adult younger than age 85 years who has certain medical conditions should be immunized. Any person who resides in a nursing home or long-term care facility should be immunized. An adult smoker should be immunized. People with an immunocompromised condition and certain other conditions  should receive both PCV13 and PPSV23 vaccines. People with human immunodeficiency virus (HIV) infection should be immunized as soon as possible after diagnosis. Immunization during chemotherapy or radiation therapy should be avoided. Routine use of PPSV23 vaccine is not recommended for American Indians, [address], or people younger than 65 years unless there are medical conditions that require PPSV23 vaccine. When indicated, people who have unknown immunization and have no record of immunization should receive PPSV23 vaccine. One-time revaccination 5 years after the first dose of PPSV23 is recommended for people aged 38 64 years who have chronic kidney failure, nephrotic syndrome, asplenia, or immunocompromised conditions. People who received 1 2 doses of PPSV23 before age 34 years should receive another dose of PPSV23 vaccine at age 42 years or later if at least 5 years have passed since the previous dose. Doses of PPSV23 are not needed for people immunized with PPSV23 at or after age 49 years.  Meningococcal vaccine. Adults with asplenia or persistent complement component deficiencies should receive 2 doses of quadrivalent meningococcal conjugate (MenACWY-D) vaccine. The doses should be obtained at least 2 months apart. Microbiologists working with certain meningococcal bacteria, military recruits, people at risk during an outbreak, and people who travel to or live in countries  with a high rate of meningitis should be immunized. A first-year college student up through age 14 years who is living in a residence hall should receive a dose if he did not receive a dose on or after his 16th birthday. Adults who have certain high-risk conditions should receive one or more doses of vaccine.  Hepatitis A vaccine. Adults who wish to be protected from this disease, have certain high-risk conditions, work with hepatitis A-infected animals, work in hepatitis A research labs, or travel to or work in countries with a high rate of hepatitis A should be immunized. Adults who were previously unvaccinated and who anticipate close contact with an international adoptee during the first 60 days after arrival in the [place] from a country with a high rate of hepatitis A should be immunized.  Hepatitis B vaccine. Adults who wish to be protected from this disease, have certain high-risk conditions, may be exposed to blood or other infectious body fluids, are household contacts or sex partners of hepatitis B positive people, are clients or workers in certain care facilities, or travel to or work in countries with a high rate of hepatitis B should be immunized.  Haemophilus influenzae type b (Hib) vaccine. A previously unvaccinated person with asplenia or sickle cell disease or having a scheduled splenectomy should receive 1 dose of Hib vaccine. Regardless of previous immunization, a recipient of a hematopoietic stem cell transplant should receive a 3-dose series 6 12 months after his successful transplant. Hib vaccine is not recommended for adults with HIV infection. Preventive Service / Frequency Ages 46 to 17  Blood pressure check.** / Every 1 to 2 years.  Lipid and cholesterol check.** / Every 5 years beginning at age 1.  Hepatitis C blood test.** / For any individual with known risks for hepatitis C.  Skin self-exam. / Monthly.  Influenza vaccine. / Every year.  Tetanus, diphtheria,  and acellular pertussis (Tdap, Td) vaccine.** / Consult your caregiver. 1 dose of Td every 10 years.  Varicella vaccine.** / Consult your caregiver.  HPV vaccine. / 3 doses over 6 months, if 26 and younger.  Measles, mumps, rubella (MMR) vaccine.** / You need at least 1 dose of MMR if you were born in 1957 or later.  You may also need a 2nd dose.  Pneumococcal 13-valent conjugate (PCV13) vaccine.** / Consult your caregiver.  Pneumococcal polysaccharide (PPSV23) vaccine.** / 1 to 2 doses if you smoke cigarettes or if you have certain conditions.  Meningococcal vaccine.** / 1 dose if you are age 97 to 74 years and a Orthoptist living in a residence hall, or have one of several medical conditions, you need to get vaccinated against meningococcal disease. You may also need additional booster doses.  Hepatitis A vaccine.** / Consult your caregiver.  Hepatitis B vaccine.** / Consult your caregiver.  Haemophilus influenzae type b (Hib) vaccine.** / Consult your caregiver. Ages 48 to 19  Blood pressure check.** / Every 1 to 2 years.  Lipid and cholesterol check.** / Every 5 years beginning at age 58.  Lung cancer screening. / Every year if you are aged 24 80 years and have a 30-pack-year history of smoking and currently smoke or have quit within the past 15 years. Yearly screening is stopped once you have quit smoking for at least 15 years or develop a health problem that would prevent you from having lung cancer treatment.  Fecal occult blood test (FOBT) of stool. / Every year beginning at age 2 and continuing until age 74. You may not have to do this test if you get colonoscopy every 10 years.  Flexible sigmoidoscopy** or colonoscopy.** / Every 5 years for a flexible sigmoidoscopy or every 10 years for a colonoscopy beginning at age 53 and continuing until age 13.  Hepatitis C blood test.** / For all people born from [birth year] through 1963 and any individual with known risks for  hepatitis C.  Skin self-exam. / Monthly.  Influenza vaccine. / Every year.  Tetanus, diphtheria, and acellular pertussis (Tdap/Td) vaccine.** / Consult your caregiver. 1 dose of Td every 10 years.  Varicella vaccine.** / Consult your caregiver.  Zoster vaccine.** / 1 dose for adults aged 22 years or older.  Measles, mumps, rubella (MMR) vaccine.** / You need at least 1 dose of MMR if you were born in 1955 or later. You may also need a 2nd dose.  Pneumococcal 13-valent conjugate (PCV13) vaccine.** / Consult your caregiver.  Pneumococcal polysaccharide (PPSV23) vaccine.** / 1 to 2 doses if you smoke cigarettes or if you have certain conditions.  Meningococcal vaccine.** / Consult your caregiver.  Hepatitis A vaccine.** / Consult your caregiver.  Hepatitis B vaccine.** / Consult your caregiver.  Haemophilus influenzae type b (Hib) vaccine.** / Consult your caregiver. Ages 64 and over  Blood pressure check.** / Every 1 to 2 years.  Lipid and cholesterol check.**/ Every 5 years beginning at age 73.  Lung cancer screening. / Every year if you are aged 7 80 years and have a 30-pack-year history of smoking and currently smoke or have quit within the past 15 years. Yearly screening is stopped once you have quit smoking for at least 15 years or develop a health problem that would prevent you from having lung cancer treatment.  Fecal occult blood test (FOBT) of stool. / Every year beginning at age 1 and continuing until age 44. You may not have to do this test if you get colonoscopy every 10 years.  Flexible sigmoidoscopy** or colonoscopy.** / Every 5 years for a flexible sigmoidoscopy or every 10 years for a colonoscopy beginning at age 32 and continuing until age 57.  Hepatitis C blood test.** / For all people born from [birth year] through 1963 and any individual with known risks for  hepatitis C.  Abdominal aortic aneurysm (AAA) screening.** / A one-time screening for ages 27 to 18 years who  are current or former smokers.  Skin self-exam. / Monthly.  Influenza vaccine. / Every year.  Tetanus, diphtheria, and acellular pertussis (Tdap/Td) vaccine.** / 1 dose of Td every 10 years.  Varicella vaccine.** / Consult your caregiver.  Zoster vaccine.** / 1 dose for adults aged 37 years or older.  Pneumococcal 13-valent conjugate (PCV13) vaccine.** / Consult your caregiver.  Pneumococcal polysaccharide (PPSV23) vaccine.** / 1 dose for all adults aged 40 years and older.  Meningococcal vaccine.** / Consult your caregiver.  Hepatitis A vaccine.** / Consult your caregiver.  Hepatitis B vaccine.** / Consult your caregiver.  Haemophilus influenzae type b (Hib) vaccine.** / Consult your caregiver. **Family history and personal history of risk and conditions may change your caregiver's recommendations. Document Released: 08/31/2001 Document Revised: 10/30/2012 Document Reviewed: 11/30/2010 [facility] Patient Information 2014 [place], [place]. Constipation, Adult Constipation is when a person has fewer than 3 bowel movements a week; has difficulty having a bowel movement; or has stools that are dry, hard, or larger than normal. As people grow older, constipation is more common. If you try to fix constipation with medicines that make you have a bowel movement (laxatives), the problem may get worse. Long-term laxative use may cause the muscles of the colon to become weak. A low-fiber diet, not taking in enough fluids, and taking certain medicines may make constipation worse. CAUSES   Certain medicines, such as antidepressants, pain medicine, iron supplements, antacids, and water pills.   Certain diseases, such as diabetes, irritable bowel syndrome (IBS), thyroid disease, or depression.   Not drinking enough water.   Not eating enough fiber-rich foods.   Stress or travel.  Lack of physical activity or exercise.  Not going to the restroom when there is the urge to have a bowel  movement.  Ignoring the urge to have a bowel movement.  Using laxatives too much. SYMPTOMS   Having fewer than 3 bowel movements a week.   Straining to have a bowel movement.   Having hard, dry, or larger than normal stools.   Feeling full or bloated.   Pain in the lower abdomen.  Not feeling relief after having a bowel movement. DIAGNOSIS  Your caregiver will take a medical history and perform a physical exam. Further testing may be done for severe constipation. Some tests may include:   A barium enema X-ray to examine your rectum, colon, and sometimes, your small intestine.  A sigmoidoscopy to examine your lower colon.  A colonoscopy to examine your entire colon. TREATMENT  Treatment will depend on the severity of your constipation and what is causing it. Some dietary treatments include drinking more fluids and eating more fiber-rich foods. Lifestyle treatments may include regular exercise. If these diet and lifestyle recommendations do not help, your caregiver may recommend taking over-the-counter laxative medicines to help you have bowel movements. Prescription medicines may be prescribed if over-the-counter medicines do not work.  HOME CARE INSTRUCTIONS   Increase dietary fiber in your diet, such as fruits, vegetables, whole grains, and beans. Limit high-fat and processed sugars in your diet, such as [place] fries, hamburgers, cookies, candies, and soda.   A fiber supplement may be added to your diet if you cannot get enough fiber from foods.   Drink enough fluids to keep your urine clear or pale yellow.   Exercise regularly or as directed by your caregiver.   Go to the restroom  when you have the urge to go. Do not hold it.  Only take medicines as directed by your caregiver. Do not take other medicines for constipation without talking to your caregiver first. SEEK IMMEDIATE MEDICAL CARE IF:   You have bright red blood in your stool.   Your constipation lasts  for more than 4 days or gets worse.   You have abdominal or rectal pain.   You have thin, pencil-like stools.  You have unexplained weight loss. MAKE SURE YOU:   Understand these instructions.  Will watch your condition.  Will get help right away if you are not doing well or get worse. Document Released: 04/02/2004 Document Revised: 09/27/2011 Document Reviewed: 06/08/2011 [facility] Patient Information 2014 [place], [place].

## 2013-06-29 ENCOUNTER — Encounter: Payer: Self-pay | Admitting: Family Medicine

## 2013-06-29 NOTE — Assessment & Plan Note (Addendum)
Per Dr. Darrick Penna notes, needs ETT at age 41.  Will schedule.

## 2013-06-29 NOTE — Progress Notes (Signed)
Subjective:    Patient ID: Frank Vargas, male    DOB: Oct 01, 1970, 42 y.o.   MRN: [id number]  Hypertension Pertinent negatives include no chest pain, headaches or shortness of breath.   Here for annual exam.  Did not take BP meds today.  Has had some recent issues with low blood pressure.  He continues to work 2 jobs and sleeps about 3 hours/day.  He does feel tired all the time on 2 pills.  Has not been actively checking BP.  He is otherwise feeling well.  He reports some minor constipation and thinks it is from his diet.  He is not getting enough veggies and fiber.  Has put on 7 pounds since last year.  Past Medical History  Diagnosis Date  . Hypertension   . Vertigo    History reviewed. No pertinent past surgical history.  No Known Allergies Current Outpatient Prescriptions on File Prior to Visit  Medication Sig Dispense Refill  . diclofenac (VOLTAREN) 75 MG EC tablet Take 75 mg by mouth 2 (two) times daily as needed.        Marland Kitchen ibuprofen (ADVIL,MOTRIN) 800 MG tablet TAKE 1 TABLET (800 MG TOTAL) BY MOUTH EVERY 8 (EIGHT) HOURS AS NEEDED FOR PAIN.  60 tablet  1   No current facility-administered medications on file prior to visit.   History   Social History  . Marital Status: Married    Spouse Name: Temitope Griffing    Number of Children: 3  . Years of Education: N/A   Occupational History  .  [employer]  . Pre-K educator     [place]   Social History Main Topics  . Smoking status: Never Smoker   . Smokeless tobacco: Never Used  . Alcohol Use: Yes     Comment: 2-4 glasses/month  . Drug Use: Not on file  . Sexual Activity: Not on file   Other Topics Concern  . Not on file   Social History Narrative  . No narrative on file   Family History  Problem Relation Age of Onset  . Diabetes Mother   . Heart disease Mother   . Hypertension Mother   . Hyperlipidemia Father   . Hypertension Maternal Uncle   . Diabetes Maternal Uncle   . Cancer Paternal Uncle    Prostate    Review of Systems  Constitutional: Positive for fatigue. Negative for fever and chills.  HENT: Negative for hearing loss, rhinorrhea and sneezing.   Eyes: Negative for visual disturbance.  Respiratory: Negative for cough and shortness of breath.   Cardiovascular: Negative for chest pain and leg swelling.  Gastrointestinal: Positive for constipation. Negative for nausea, vomiting, abdominal pain, diarrhea and blood in stool.  Genitourinary: Negative for dysuria, hematuria and difficulty urinating.  Musculoskeletal: Negative for back pain and joint swelling.  Skin: Negative for rash.  Neurological: Negative for dizziness, syncope and headaches.  Psychiatric/Behavioral: Negative for dysphoric mood. The patient is not nervous/anxious.        Objective:   Physical Exam  Vitals reviewed. Constitutional: He is oriented to person, place, and time. He appears well-developed and well-nourished. No distress.  HENT:  Head: Normocephalic and atraumatic.  Eyes: Conjunctivae are normal. No scleral icterus.  Neck: Neck supple. No thyromegaly present.  Cardiovascular: Normal rate and regular rhythm.   No murmur heard. Pulmonary/Chest: Effort normal and breath sounds normal. He has no wheezes.  Abdominal: Soft. He exhibits no mass. There is no tenderness.  Musculoskeletal: Normal range of motion.  He exhibits no edema.  Neurological: He is alert and oriented to person, place, and time.  Skin: Skin is warm and dry. No rash noted.  Psychiatric: He has a normal mood and affect. His behavior is normal.          Assessment & Plan:

## 2013-06-29 NOTE — Assessment & Plan Note (Signed)
Check PSA. ?

## 2013-06-29 NOTE — Assessment & Plan Note (Signed)
Continue exercises if needed and antivert if needed.

## 2013-06-29 NOTE — Assessment & Plan Note (Signed)
Doing well--will give flu shot today

## 2013-06-29 NOTE — Assessment & Plan Note (Signed)
BP up a little today--no meds--encouraged to begin at 1 tab daily, to resume his anti-hypertensive.  Last lipid check was 2013 and mildly elevated.  Would need repeat when fasting.

## 2013-08-24 ENCOUNTER — Other Ambulatory Visit: Payer: Self-pay | Admitting: *Deleted

## 2013-08-24 DIAGNOSIS — H8110 Benign paroxysmal vertigo, unspecified ear: Secondary | ICD-10-CM

## 2013-08-24 DIAGNOSIS — I10 Essential (primary) hypertension: Secondary | ICD-10-CM

## 2013-08-24 DIAGNOSIS — Z8249 Family history of ischemic heart disease and other diseases of the circulatory system: Secondary | ICD-10-CM

## 2013-09-07 ENCOUNTER — Ambulatory Visit (HOSPITAL_BASED_OUTPATIENT_CLINIC_OR_DEPARTMENT_OTHER): Payer: BC Managed Care – PPO | Admitting: Family Medicine

## 2013-09-07 ENCOUNTER — Other Ambulatory Visit: Payer: Self-pay

## 2013-09-07 ENCOUNTER — Ambulatory Visit (HOSPITAL_COMMUNITY)
Admission: RE | Admit: 2013-09-07 | Discharge: 2013-09-07 | Disposition: A | Discharge status: Home / Self Care | Payer: BC Managed Care – PPO | Source: Ambulatory Visit | Attending: Family Medicine | Admitting: Family Medicine

## 2013-09-07 DIAGNOSIS — Z8249 Family history of ischemic heart disease and other diseases of the circulatory system: Secondary | ICD-10-CM

## 2013-09-07 DIAGNOSIS — R079 Chest pain, unspecified: Secondary | ICD-10-CM | POA: Insufficient documentation

## 2013-09-07 DIAGNOSIS — H8110 Benign paroxysmal vertigo, unspecified ear: Secondary | ICD-10-CM

## 2013-09-07 DIAGNOSIS — I10 Essential (primary) hypertension: Secondary | ICD-10-CM

## 2013-09-07 NOTE — Progress Notes (Signed)
Patient ID: Frank Vargas, male   DOB: [birth year], 43 y.o.   MRN: [id number] Here for Exercise Treadmill Test for screening given sig FH and his own risk factors. No symptoms Has been running 4 miles 2-3 days a week. See MUSE for ETT data Negative by EKG criteria for ischemia Reached and exceeded THR

## 2013-10-08 ENCOUNTER — Ambulatory Visit (INDEPENDENT_AMBULATORY_CARE_PROVIDER_SITE_OTHER): Payer: BC Managed Care – PPO | Admitting: Family Medicine

## 2013-10-08 ENCOUNTER — Encounter: Payer: Self-pay | Admitting: Family Medicine

## 2013-10-08 VITALS — BP 121/76 | HR 58 | Temp 97.6°F | Ht 67.0 in | Wt 207.0 lb

## 2013-10-08 DIAGNOSIS — Z8249 Family history of ischemic heart disease and other diseases of the circulatory system: Secondary | ICD-10-CM

## 2013-10-08 DIAGNOSIS — B351 Tinea unguium: Secondary | ICD-10-CM

## 2013-10-08 LAB — COMPREHENSIVE METABOLIC PANEL
ALBUMIN: 3.9 g/dL (ref 3.5–5.2)
ALT: 52 U/L (ref 0–53)
AST: 27 U/L (ref 0–37)
Alkaline Phosphatase: 78 U/L (ref 39–117)
BUN: 13 mg/dL (ref 6–23)
CALCIUM: 9.7 mg/dL (ref 8.4–10.5)
CHLORIDE: 100 meq/L (ref 96–112)
CO2: 32 meq/L (ref 19–32)
CREATININE: 1.12 mg/dL (ref 0.50–1.35)
Glucose, Bld: 102 mg/dL — ABNORMAL HIGH (ref 70–99)
POTASSIUM: 3.8 meq/L (ref 3.5–5.3)
Sodium: 138 mEq/L (ref 135–145)
Total Bilirubin: 0.3 mg/dL (ref 0.2–1.2)
Total Protein: 6.8 g/dL (ref 6.0–8.3)

## 2013-10-08 MED ORDER — TERBINAFINE HCL 250 MG PO TABS: 250.0000 mg | ORAL_TABLET | Freq: Every day | ORAL | Status: DC

## 2013-10-08 NOTE — Assessment & Plan Note (Signed)
Passed his stress test.

## 2013-10-08 NOTE — Progress Notes (Signed)
    Subjective:    Patient ID: Frank Vargas is a 43 y.o. male presenting with feet extremely dry and itchy/peeling  on 10/08/2013  HPI: Reports foot peeling x 1 year.  Has tried lamisil which improved it, but then it is back. Now using Lamisil.  Also has a thickened nail that is getting black under it.  Feet also itch.  Review of Systems  Constitutional: Negative for fever and chills.  HENT: Negative for sneezing.   Respiratory: Negative for shortness of breath.   Cardiovascular: Negative for chest pain.  Gastrointestinal: Negative for nausea, vomiting and abdominal pain.  Skin: Positive for rash.      Objective:    BP 121/76  Pulse 58  Temp(Src) 97.6 F (36.4 C) (Oral)  Ht 5\' 7"  (1.702 m)  Wt 207 lb (93.895 kg)  BMI 32.41 kg/m2 Physical Exam  Constitutional: He is oriented to person, place, and time. He appears well-developed and well-nourished.  HENT:  Head: Normocephalic and atraumatic.  Cardiovascular: Normal rate.   Pulmonary/Chest: Effort normal.  Abdominal: Soft. There is no tenderness.  Neurological: He is alert and oriented to person, place, and time.  Skin: Skin is dry.  Diffuse peeling bilaterally. Nail is thickened and yellowed.        Assessment & Plan:   Onychomycosis Associated with tinea pedis, unresponsive to topical treatment--trial of Lamisil.  Will check LFT's.  Family history of cardiovascular disease Passed his stress test.    Return in about 6 months (around 04/10/2014).

## 2013-10-08 NOTE — Assessment & Plan Note (Signed)
Associated with tinea pedis, unresponsive to topical treatment--trial of Lamisil.  Will check LFT's.

## 2013-10-08 NOTE — Patient Instructions (Signed)

## 2013-10-09 ENCOUNTER — Telehealth: Payer: Self-pay | Admitting: *Deleted

## 2013-10-09 NOTE — Telephone Encounter (Signed)
LM for patient informing him of his results.  Jazmin Hartsell,CMA

## 2013-10-09 NOTE — Telephone Encounter (Signed)
Message copied by Henri MedalHARTSELL, JAZMIN M on Tue Oct 09, 2013 10:31 AM ------      Message from: Reva BoresPRATT, TANYA S      Created: Tue Oct 09, 2013 10:07 AM       Please inform pt, labs are ok--he may start medication. ------

## 2014-01-04 ENCOUNTER — Other Ambulatory Visit: Payer: Self-pay | Admitting: Family Medicine

## 2014-06-13 ENCOUNTER — Other Ambulatory Visit: Payer: Self-pay | Admitting: Family Medicine

## 2014-10-15 ENCOUNTER — Other Ambulatory Visit: Payer: Self-pay | Admitting: *Deleted

## 2014-10-15 MED ORDER — IBUPROFEN 800 MG PO TABS: 800.0000 mg | ORAL_TABLET | Freq: Three times a day (TID) | ORAL | Status: DC | PRN

## 2014-11-06 ENCOUNTER — Encounter: Payer: Self-pay | Admitting: *Deleted

## 2014-11-12 ENCOUNTER — Encounter: Payer: Self-pay | Admitting: *Deleted

## 2015-06-07 ENCOUNTER — Other Ambulatory Visit: Payer: Self-pay | Admitting: Family Medicine

## 2015-06-24 ENCOUNTER — Other Ambulatory Visit: Payer: Self-pay | Admitting: *Deleted

## 2015-06-24 MED ORDER — IBUPROFEN 800 MG PO TABS: 800.0000 mg | ORAL_TABLET | Freq: Three times a day (TID) | ORAL | Status: DC | PRN

## 2015-06-30 ENCOUNTER — Ambulatory Visit (INDEPENDENT_AMBULATORY_CARE_PROVIDER_SITE_OTHER): Payer: BLUE CROSS/BLUE SHIELD | Admitting: Sports Medicine

## 2015-06-30 ENCOUNTER — Encounter: Payer: Self-pay | Admitting: Sports Medicine

## 2015-06-30 VITALS — BP 160/110 | Ht 67.0 in | Wt 210.0 lb

## 2015-06-30 DIAGNOSIS — J0120 Acute ethmoidal sinusitis, unspecified: Secondary | ICD-10-CM

## 2015-06-30 MED ORDER — CETIRIZINE HCL 10 MG PO TABS: 10.0000 mg | ORAL_TABLET | Freq: Every day | ORAL | Status: DC

## 2015-06-30 MED ORDER — FLUTICASONE PROPIONATE 50 MCG/ACT NA SUSP: 2.0000 | Freq: Every day | NASAL | Status: DC

## 2015-06-30 MED ORDER — AMOXICILLIN-POT CLAVULANATE 500-125 MG PO TABS: 1.0000 | ORAL_TABLET | Freq: Three times a day (TID) | ORAL | Status: DC

## 2015-06-30 MED ORDER — LORATADINE 10 MG PO TABS: 10.0000 mg | ORAL_TABLET | Freq: Every day | ORAL | Status: DC

## 2015-06-30 NOTE — Progress Notes (Signed)
   Subjective:    Patient ID: Carmon Sailsrthur L Gaede, male    DOB: [birth year], 44 y.o.   MRN: [id number]  HPI Mr. Marina Goodellerry is a 44yo male presenting today for sinus complaints. - Notes wide variability in symptoms including sinus pressure and drainage, malaise, congestion, rhinorrhea, productive cough, and occasional wheezing. Also notes chills and subjective fevers. Today notes productive cough, rhinorrhea, sinus headache. - Did not objectively measure temperature. - Symptoms noted since October 2016 - Has tried many medications, including Theraflu, alka seltzer, nasal saline.  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 Normally takes Zyrtec, but has not tried this for the complaints noted above   Review of Systems Per HPI    Objective:   Physical Exam  Constitutional: He appears well-developed and well-nourished. No distress.  HENT:  Head: Normocephalic and atraumatic.  Pink and edematous nasal conchae noted, post nasal drip with erythema of pharynx noted, tympanic membrane normal bilaterally  Eyes: Conjunctivae are normal. Right eye exhibits no discharge. Left eye exhibits no discharge.  Cardiovascular: Normal rate.  Exam reveals no gallop and no friction rub.   No murmur heard. Pulmonary/Chest: Effort normal. No respiratory distress. He has no wheezes. He has no rales.  Abdominal: Soft. He exhibits no distension. There is no tenderness.  Musculoskeletal: He exhibits no edema.  Skin: Skin is warm and dry. No rash noted.  Psychiatric: He has a normal mood and affect. His behavior is normal.      Assessment & Plan:   # 1. Seasonal Allergies with Postnasal Drip: History of cough, congestion, and rhinorrhea noted. History of seasonal allergies, usually well controlled with Zyrtec but has not taken recently.  # 2.  Sinusitis: Suspected to be secondary to prolonged and untreated course above. History of sinus drainage and sinus headache. Subjective fever and chills noted.   Plan: - Prescription for Augmentin given to take every eight hours for seven days. - Flonase daily - Claritin daily - If no improvement over the next two to three weeks, [facility] for further work up and treatment.  Patient seen and evaluated with the resident. I agree with the above plan of care.

## 2015-06-30 NOTE — Patient Instructions (Signed)
Thank you so much for coming to visit us today! I have sent in a prescription for an antibiotic (Augmentin) for you to take every 8 hours for seven days. I have also sent in a nasal spray and allergy medication for you to use daily. If your symptoms do not start to improve over the next 2-3 weeks, please return for follow up!  Thanks again! Dr. Caroleen Hammanumley

## 2015-07-29 ENCOUNTER — Encounter: Payer: Self-pay | Admitting: Sports Medicine

## 2015-07-29 ENCOUNTER — Ambulatory Visit (INDEPENDENT_AMBULATORY_CARE_PROVIDER_SITE_OTHER): Payer: BLUE CROSS/BLUE SHIELD | Admitting: Sports Medicine

## 2015-07-29 VITALS — BP 138/98 | Ht 68.0 in | Wt 210.0 lb

## 2015-07-29 DIAGNOSIS — H8110 Benign paroxysmal vertigo, unspecified ear: Secondary | ICD-10-CM | POA: Diagnosis not present

## 2015-07-29 MED ORDER — MECLIZINE HCL 25 MG PO TABS: 25.0000 mg | ORAL_TABLET | Freq: Three times a day (TID) | ORAL | Status: DC

## 2015-07-29 NOTE — Patient Instructions (Signed)
Dr Narda Bondshris Newman [address] [place] [id number] [phone] Wednesday January 11th at 1015am

## 2015-07-29 NOTE — Progress Notes (Signed)
   Subjective:    Patient ID: Frank Vargas, male    DOB: [birth year], 45 y.o.   MRN: [id number]  HPI  Chief complaint: Dizzy spells   Patient comes in today with a complaint of 4 days of "dizzy spells". He has a history of vertigo and states that his symptoms are identical in nature to what he experienced back in 2014. He was treated at that time with meclizine as well as with instruction in Epley's exercises. Treatment was successful. He was asymptomatic up until the end of last week when he all of a sudden noticed some returning vertigo while watching TV. His symptoms have improved over the past few days but have not completely resolved. Symptoms are most noticeable when going from looking down at the floor to looking up. He describes his episodes as "the room spinning". He tried taking some leftover meclizine but it was ineffective. He also tried the Epleys  Maneuvers but this made his symptoms worse. No  Vomiting. No numbness or tingling. His symptoms actually seem to be worse at rest and better with activity. He has been unable to lay flat to sleep for the past few days as this makes his symptoms worse.  He is also concerned about the etiology of his vertigo. Although he understands that most cases are benign positional vertigo he is concerned that there may be another reason.   past medical history reviewed. Medical history significant for a recent sinus infection which was treated with Augmentin. That was one month ago.  Medications reviewed  Allergies reviewed    Review of Systems  as above    Objective:   Physical Exam  well-developed, well-nourished. No acute distress.   HEENT : normocephalic, atraumatic. Pupils equal round and reactive to light and accommodation. Extraocular movement is intact. Mild nystagmus with leftward gaze. No reproducible vertigo with cervical rotation. Tympanic membranes are intact and clear. Nares clear.  Throat is clear.        Assessment & Plan:   Returning vertigo   New prescription for meclizine. Patient would be interested in a referral to ENT. We will set that up for him. Follow-up with me as needed.

## 2015-08-20 ENCOUNTER — Other Ambulatory Visit: Payer: Self-pay | Admitting: Family Medicine

## 2015-11-03 ENCOUNTER — Encounter: Payer: Self-pay | Admitting: *Deleted

## 2016-01-28 ENCOUNTER — Encounter: Payer: Self-pay | Admitting: *Deleted

## 2016-02-09 ENCOUNTER — Other Ambulatory Visit: Payer: Self-pay | Admitting: *Deleted

## 2016-02-09 MED ORDER — IBUPROFEN 800 MG PO TABS: 800.0000 mg | ORAL_TABLET | Freq: Three times a day (TID) | ORAL | 4 refills | Status: DC | PRN

## 2016-05-29 ENCOUNTER — Other Ambulatory Visit: Payer: Self-pay | Admitting: Family Medicine

## 2016-06-23 ENCOUNTER — Encounter: Payer: Self-pay | Admitting: *Deleted

## 2016-07-07 ENCOUNTER — Encounter: Payer: Self-pay | Admitting: Student

## 2016-07-07 ENCOUNTER — Ambulatory Visit (INDEPENDENT_AMBULATORY_CARE_PROVIDER_SITE_OTHER): Payer: BLUE CROSS/BLUE SHIELD | Admitting: Student

## 2016-07-07 VITALS — BP 141/98 | HR 103 | Temp 100.6°F | Ht 67.0 in | Wt 206.6 lb

## 2016-07-07 DIAGNOSIS — R69 Illness, unspecified: Secondary | ICD-10-CM | POA: Diagnosis not present

## 2016-07-07 DIAGNOSIS — J111 Influenza due to unidentified influenza virus with other respiratory manifestations: Secondary | ICD-10-CM | POA: Insufficient documentation

## 2016-07-07 MED ORDER — OSELTAMIVIR PHOSPHATE 75 MG PO CAPS: 75.0000 mg | ORAL_CAPSULE | Freq: Two times a day (BID) | ORAL | 0 refills | Status: DC

## 2016-07-07 NOTE — Assessment & Plan Note (Signed)
Patient with flulike symptoms. Symptom onset less than 48 hours.  -Gave Tamiflu for 5 days -Discussed return precautions including persistent fever, shortness of breath, chest pain or other symptoms concerning to him -Gave work notes

## 2016-07-07 NOTE — Patient Instructions (Signed)
It was great seeing you today! Your symptoms are consistent with influenza (flu). I have sent a prescription for Tamiflu to your pharmacy. This will reduce the duration of your symptoms by 1 day. Please come back and see us if your symptoms are worse or if you have trouble breathing, chest pain or other symptoms concerning to you.    If we did any lab work today, and the results require attention, either me or my nurse will get in touch with you. If everything is normal, you will get a letter in mail. If you don't hear from us in two weeks, please give us a call. Otherwise, we look forward to seeing you again at your next visit. If you have any questions or concerns before then, please call the clinic at [phone].   Please bring all your medications to every doctors visit   Sign up for My Chart to have easy access to your labs results, and communication with your Primary care physician.     Please check-out at the front desk before leaving the clinic.    Take Care,

## 2016-07-07 NOTE — Progress Notes (Signed)
  Subjective:    Frank Vargas is a 45 y.o. old male here for general body ache HPI Myalgia: Since last night. Also reports runny nose, congestion, tightness in chest and dry cough for one day. He had fever to 100.4 this morning. Denies sore throat, shortness of breath, pleuritic chest pain, skin rash, nausea, vomiting, diarrhea, leg pain or swelling. Had mild congestion and cough last week. Has been taking OTC sinus and congestion. 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 Didn't have flu vaccine this year. He denies history of asthma or other medical condition except hypertension. Denies recent travel or use of supplement.   Review of Systems  A 12 point review of systems negative except for those in history of present illness  History and Problem List: Frank Vargas has OBESITY; HYPERTENSION, BENIGN; Rotator cuff syndrome of right shoulder; Routine general medical examination at a health care facility; Benign positional vertigo; Family history of cardiovascular disease; Family history of prostate cancer; Onychomycosis; and Influenza-like illness on his problem list.  Frank Vargas  has a past medical history of Hypertension and Vertigo.  Immunizations needed: Due for flu vaccine and Tdap     Objective:    BP (!) 141/98 (BP Location: Left Arm, Patient Position: Sitting, Cuff Size: Large)   Pulse (!) 103   Temp (!) 100.6 F (38.1 C) (Oral)   Ht 5\' 7"  (1.702 m)   Wt 206 lb 9.6 oz (93.7 kg)   SpO2 98%   BMI 32.36 kg/m  Physical Exam GEN: appears well, no apparent distress. Head: normocephalic and atraumatic  Eyes: without conjunctival injection, sclera anicteric Ears: normal TM and ear canal,  Nares: Positive for rhinorrhea, congestion and erythema bilaterally  Oropharynx: mmm without erythema or exudation HEM: negative for cervical or periauricular lymphadenopathies CVS: RRR, normal s1 and s2, no murmurs, no edema, cap refills < 2 secs RESP: no increased work of breathing, good  air movement bilaterally, no rhonchi, crackles or wheeze GI: Bowel sounds present and normal, soft, non-tender GU: no suprapubic or CVA tenderness MSK: No tenderness to palpation over sinuses SKIN: No apparent skin lesion NEURO: alert and oiented appropriately, no gross defecits  PSYCH: appropriate mood and affect     Assessment and Plan:   Problem List Items Addressed This Visit      Other   Influenza-like illness - Primary    Patient with flulike symptoms. Symptom onset less than 48 hours.  -Gave Tamiflu for 5 days -Discussed return precautions including persistent fever, shortness of breath, chest pain or other symptoms concerning to him -Gave work notes      Relevant Medications   oseltamivir (TAMIFLU) 75 MG capsule      Return if symptoms worsen or fail to improve.  Almon Herculesaye T Kavian Peters, MD

## 2016-07-13 ENCOUNTER — Other Ambulatory Visit: Payer: Self-pay | Admitting: Student

## 2016-07-13 DIAGNOSIS — J111 Influenza due to unidentified influenza virus with other respiratory manifestations: Secondary | ICD-10-CM

## 2016-07-13 DIAGNOSIS — R69 Illness, unspecified: Principal | ICD-10-CM

## 2016-08-10 ENCOUNTER — Other Ambulatory Visit: Payer: Self-pay | Admitting: *Deleted

## 2016-08-10 MED ORDER — MECLIZINE HCL 25 MG PO TABS: 25.0000 mg | ORAL_TABLET | Freq: Three times a day (TID) | ORAL | 3 refills | Status: DC

## 2016-08-25 ENCOUNTER — Ambulatory Visit: Payer: BLUE CROSS/BLUE SHIELD | Admitting: Family Medicine

## 2016-08-27 ENCOUNTER — Telehealth: Payer: Self-pay | Admitting: Family Medicine

## 2016-08-27 NOTE — Telephone Encounter (Signed)
Will check with MD to see if she has received FMLA papers from this patient.  Jazmin Hartsell,CMA

## 2016-08-27 NOTE — Telephone Encounter (Signed)
Frank Vargas states they have not received FMLA forms from us for pt. Fax [phone]. ep

## 2016-08-31 NOTE — Telephone Encounter (Signed)
Patient has the forms in his possession and will bring them when he comes in on 09/02/16. Jazmin Hartsell,CMA

## 2016-09-02 ENCOUNTER — Ambulatory Visit (INDEPENDENT_AMBULATORY_CARE_PROVIDER_SITE_OTHER): Payer: BLUE CROSS/BLUE SHIELD | Admitting: Family Medicine

## 2016-09-02 ENCOUNTER — Ambulatory Visit: Payer: BLUE CROSS/BLUE SHIELD | Admitting: Family Medicine

## 2016-09-02 ENCOUNTER — Encounter: Payer: Self-pay | Admitting: Family Medicine

## 2016-09-02 VITALS — BP 118/60 | HR 64 | Temp 98.0°F | Ht 67.0 in | Wt 202.0 lb

## 2016-09-02 DIAGNOSIS — Z0001 Encounter for general adult medical examination with abnormal findings: Secondary | ICD-10-CM

## 2016-09-02 DIAGNOSIS — H8113 Benign paroxysmal vertigo, bilateral: Secondary | ICD-10-CM | POA: Diagnosis not present

## 2016-09-02 DIAGNOSIS — Z8042 Family history of malignant neoplasm of prostate: Secondary | ICD-10-CM

## 2016-09-02 DIAGNOSIS — R945 Abnormal results of liver function studies: Secondary | ICD-10-CM

## 2016-09-02 DIAGNOSIS — R7989 Other specified abnormal findings of blood chemistry: Secondary | ICD-10-CM

## 2016-09-02 DIAGNOSIS — Z0000 Encounter for general adult medical examination without abnormal findings: Secondary | ICD-10-CM

## 2016-09-02 DIAGNOSIS — R946 Abnormal results of thyroid function studies: Secondary | ICD-10-CM

## 2016-09-02 DIAGNOSIS — I10 Essential (primary) hypertension: Secondary | ICD-10-CM

## 2016-09-02 DIAGNOSIS — Z23 Encounter for immunization: Secondary | ICD-10-CM

## 2016-09-02 DIAGNOSIS — M25512 Pain in left shoulder: Secondary | ICD-10-CM | POA: Diagnosis not present

## 2016-09-02 LAB — CBC WITH DIFFERENTIAL/PLATELET
BASOS ABS: 0 {cells}/uL (ref 0–200)
BASOS PCT: 0 %
EOS ABS: 168 {cells}/uL (ref 15–500)
Eosinophils Relative: 3 %
HEMATOCRIT: 43.7 % (ref 38.5–50.0)
Hemoglobin: 14.7 g/dL (ref 13.2–17.1)
Lymphocytes Relative: 46 %
Lymphs Abs: 2576 cells/uL (ref 850–3900)
MCH: 28.8 pg (ref 27.0–33.0)
MCHC: 33.6 g/dL (ref 32.0–36.0)
MCV: 85.5 fL (ref 80.0–100.0)
MONO ABS: 1008 {cells}/uL — AB (ref 200–950)
MPV: 10.1 fL (ref 7.5–12.5)
Monocytes Relative: 18 %
NEUTROS ABS: 1848 {cells}/uL (ref 1500–7800)
Neutrophils Relative %: 33 %
Platelets: 268 10*3/uL (ref 140–400)
RBC: 5.11 MIL/uL (ref 4.20–5.80)
RDW: 13.6 % (ref 11.0–15.0)
WBC: 5.6 10*3/uL (ref 3.8–10.8)

## 2016-09-02 LAB — LIPID PANEL
CHOL/HDL RATIO: 3.7 ratio (ref <5.0)
CHOLESTEROL: 162 mg/dL (ref <200)
HDL: 44 mg/dL (ref >40)
LDL Cholesterol: 98 mg/dL (ref <100)
TRIGLYCERIDES: 101 mg/dL (ref <150)
VLDL: 20 mg/dL (ref <30)

## 2016-09-02 LAB — HIV ANTIBODY (ROUTINE TESTING W REFLEX): HIV 1&2 Ab, 4th Generation: NONREACTIVE

## 2016-09-02 LAB — COMPREHENSIVE METABOLIC PANEL
ALBUMIN: 4 g/dL (ref 3.6–5.1)
ALK PHOS: 71 U/L (ref 40–115)
ALT: 133 U/L — ABNORMAL HIGH (ref 9–46)
AST: 48 U/L — ABNORMAL HIGH (ref 10–40)
BUN: 22 mg/dL (ref 7–25)
CALCIUM: 9.4 mg/dL (ref 8.6–10.3)
CHLORIDE: 102 mmol/L (ref 98–110)
CO2: 27 mmol/L (ref 20–31)
Creat: 1.05 mg/dL (ref 0.60–1.35)
Glucose, Bld: 92 mg/dL (ref 65–99)
POTASSIUM: 3.9 mmol/L (ref 3.5–5.3)
Sodium: 137 mmol/L (ref 135–146)
TOTAL PROTEIN: 7 g/dL (ref 6.1–8.1)
Total Bilirubin: 0.4 mg/dL (ref 0.2–1.2)

## 2016-09-02 MED ORDER — METHYLPREDNISOLONE ACETATE 40 MG/ML IJ SUSP
40.0000 mg | Freq: Once | INTRAMUSCULAR | Status: AC
  Administered 2016-09-02: 40 mg via INTRAMUSCULAR

## 2016-09-02 NOTE — Assessment & Plan Note (Signed)
Lengthy discussion had around PSA screening. Declined for now. Immunization and laboratory update

## 2016-09-02 NOTE — Progress Notes (Signed)
Subjective:    Patient ID: Frank Vargas is a 46 y.o. male presenting with Annual Exam  on 09/02/2016  HPI: Here today for CPE. Notes continued issues with vertigo. Has had symptoms for a long time. Last summer had episode that lasted multiple days and he was unable to leave the house or even the room. He slept where he lay. Since then, has had some intermittent issues. Uses Meclizine when symptoms begin, but cannot take all the time, as it makes him too drowsy to work. He also reports significant left shoulder pain, relieved with shoulder injection previously.  Review of Systems  Constitutional: Negative for chills, fatigue and fever.  HENT: Negative for hearing loss, rhinorrhea and sneezing.   Eyes: Negative for visual disturbance.  Respiratory: Negative for cough and shortness of breath.   Cardiovascular: Negative for chest pain and leg swelling.  Gastrointestinal: Negative for abdominal pain, blood in stool, constipation, diarrhea, nausea and vomiting.  Genitourinary: Negative for difficulty urinating, dysuria and hematuria.  Musculoskeletal: Positive for arthralgias. Negative for back pain and joint swelling.  Skin: Negative for rash.  Neurological: Positive for dizziness. Negative for syncope and headaches.  Psychiatric/Behavioral: Negative for dysphoric mood. The patient is not nervous/anxious.       Objective:    BP 118/60   Pulse 64   Temp 98 F (36.7 C) (Oral)   Ht 5\' 7"  (1.702 m)   Wt 202 lb (91.6 kg)   SpO2 97%   BMI 31.64 kg/m  Physical Exam  Constitutional: He is oriented to person, place, and time. He appears well-developed and well-nourished. No distress.  HENT:  Head: Normocephalic and atraumatic.  Eyes: Conjunctivae are normal. No scleral icterus.  Neck: Neck supple. No thyromegaly present.  Cardiovascular: Normal rate and regular rhythm.   No murmur heard. Pulmonary/Chest: Effort normal and breath sounds normal. He has no wheezes.  Abdominal: Soft. He  exhibits no mass. There is no tenderness.  Musculoskeletal: He exhibits no edema.       Left shoulder: He exhibits decreased range of motion and crepitus. He exhibits no tenderness, no bony tenderness, no swelling, no effusion, no deformity, normal pulse and normal strength.  Neurological: He is alert and oriented to person, place, and time.  Skin: Skin is warm and dry. No rash noted.  Psychiatric: He has a normal mood and affect. His behavior is normal.  Vitals reviewed.  Procedure: Verbal and written consent obtained. Left shoulder placed in relaxed position position. Landmarks identified. Posterior approach through subacromial bursa used. Injection of 1 cc depomedrol injected easily.  Pt. Tolerated it well.       Assessment & Plan:   Problem List Items Addressed This Visit      Unprioritized   HYPERTENSION, BENIGN    BP well controlled. Continue Zestoretic      Routine general medical examination at a health care facility    Lengthy discussion had around PSA screening. Declined for now. Immunization and laboratory update      Relevant Orders   HIV antibody   Benign positional vertigo    FMLA paperwork filled out, as when affected cannot perform ADL's, much less job related responsibilities. Continue Meclizine prn. Has exercises to do to try for treatment.      Family history of prostate cancer    Delay PSA for now due to how old his relatives were when diagnosed.       Other Visit Diagnoses    Encounter for general adult medical  examination with abnormal findings    -  Primary   Relevant Orders   CBC with Differential   Comprehensive metabolic panel   Lipid panel   TSH   Tdap vaccine greater than or equal to 7yo IM (Completed)   Left shoulder pain, unspecified chronicity       s/p injection today   Relevant Medications   methylPREDNISolone acetate (DEPO-MEDROL) injection 40 mg (Completed)       Return in 1 year (on 09/02/2017).  Reva Boresanya S  Griffon Herberg 09/02/2016 3:06 PM

## 2016-09-02 NOTE — Assessment & Plan Note (Signed)
BP well controlled. Continue Zestoretic

## 2016-09-02 NOTE — Assessment & Plan Note (Signed)
Delay PSA for now due to how old his relatives were when diagnosed.

## 2016-09-02 NOTE — Assessment & Plan Note (Signed)
FMLA paperwork filled out, as when affected cannot perform ADL's, much less job related responsibilities. Continue Meclizine prn. Has exercises to do to try for treatment.

## 2016-09-02 NOTE — Patient Instructions (Signed)
Health Maintenance, Male A healthy lifestyle and preventative care can promote health and wellness.  Maintain regular health, dental, and eye exams.  Eat a healthy diet. Foods like vegetables, fruits, whole grains, low-fat dairy products, and lean protein foods contain the nutrients you need and are low in calories. Decrease your intake of foods high in solid fats, added sugars, and salt. Get information about a proper diet from your health care provider, if necessary.  Regular physical exercise is one of the most important things you can do for your health. Most adults should get at least 150 minutes of moderate-intensity exercise (any activity that increases your heart rate and causes you to sweat) each week. In addition, most adults need muscle-strengthening exercises on 2 or more days a week.   Maintain a healthy weight. The body mass index (BMI) is a screening tool to identify possible weight problems. It provides an estimate of body fat based on height and weight. Your health care provider can find your BMI and can help you achieve or maintain a healthy weight. For males 20 years and older:  A BMI below 18.5 is considered underweight.  A BMI of 18.5 to 24.9 is normal.  A BMI of 25 to 29.9 is considered overweight.  A BMI of 30 and above is considered obese.  Maintain normal blood lipids and cholesterol by exercising and minimizing your intake of saturated fat. Eat a balanced diet with plenty of fruits and vegetables. Blood tests for lipids and cholesterol should begin at age 20 and be repeated every 5 years. If your lipid or cholesterol levels are high, you are over age 50, or you are at high risk for heart disease, you may need your cholesterol levels checked more frequently.Ongoing high lipid and cholesterol levels should be treated with medicines if diet and exercise are not working.  If you smoke, find out from your health care provider how to quit. If you do not use tobacco, do  not start.  Lung cancer screening is recommended for adults aged 55-80 years who are at high risk for developing lung cancer because of a history of smoking. A yearly low-dose CT scan of the lungs is recommended for people who have at least a 30-pack-year history of smoking and are current smokers or have quit within the past 15 years. A pack year of smoking is smoking an average of 1 pack of cigarettes a day for 1 year (for example, a 30-pack-year history of smoking could mean smoking 1 pack a day for 30 years or 2 packs a day for 15 years). Yearly screening should continue until the smoker has stopped smoking for at least 15 years. Yearly screening should be stopped for people who develop a health problem that would prevent them from having lung cancer treatment.  If you choose to drink alcohol, do not have more than 2 drinks per day. One drink is considered to be 12 oz (360 mL) of beer, 5 oz (150 mL) of wine, or 1.5 oz (45 mL) of liquor.  Avoid the use of street drugs. Do not share needles with anyone. Ask for help if you need support or instructions about stopping the use of drugs.  High blood pressure causes heart disease and increases the risk of stroke. High blood pressure is more likely to develop in:  People who have blood pressure in the end of the normal range (100-139/85-89 mm Hg).  People who are overweight or obese.  People who are African American.    If you are 18-39 years of age, have your blood pressure checked every 3-5 years. If you are 40 years of age or older, have your blood pressure checked every year. You should have your blood pressure measured twice-once when you are at a hospital or clinic, and once when you are not at a hospital or clinic. Record the average of the two measurements. To check your blood pressure when you are not at a hospital or clinic, you can use:  An automated blood pressure machine at a pharmacy.  A home blood pressure monitor.  If you are 45-79  years old, ask your health care provider if you should take aspirin to prevent heart disease.  Diabetes screening involves taking a blood sample to check your fasting blood sugar level. This should be done once every 3 years after age 45 if you are at a normal weight and without risk factors for diabetes. Testing should be considered at a younger age or be carried out more frequently if you are overweight and have at least 1 risk factor for diabetes.  Colorectal cancer can be detected and often prevented. Most routine colorectal cancer screening begins at the age of 50 and continues through age 75. However, your health care provider may recommend screening at an earlier age if you have risk factors for colon cancer. On a yearly basis, your health care provider may provide home test kits to check for hidden blood in the stool. A small camera at the end of a tube may be used to directly examine the colon (sigmoidoscopy or colonoscopy) to detect the earliest forms of colorectal cancer. Talk to your health care provider about this at age 50 when routine screening begins. A direct exam of the colon should be repeated every 5-10 years through age 75, unless early forms of precancerous polyps or small growths are found.  People who are at an increased risk for hepatitis B should be screened for this virus. You are considered at high risk for hepatitis B if:  You were born in a country where hepatitis B occurs often. Talk with your health care provider about which countries are considered high risk.  Your parents were born in a high-risk country and you have not received a shot to protect against hepatitis B (hepatitis B vaccine).  You have HIV or AIDS.  You use needles to inject street drugs.  You live with, or have sex with, someone who has hepatitis B.  You are a man who has sex with other men (MSM).  You get hemodialysis treatment.  You take certain medicines for conditions like cancer, organ  transplantation, and autoimmune conditions.  Hepatitis C blood testing is recommended for all people born from 1945 through 1965 and any individual with known risk factors for hepatitis C.  Healthy men should no longer receive prostate-specific antigen (PSA) blood tests as part of routine cancer screening. Talk to your health care provider about prostate cancer screening.  Testicular cancer screening is not recommended for adolescents or adult males who have no symptoms. Screening includes self-exam, a health care provider exam, and other screening tests. Consult with your health care provider about any symptoms you have or any concerns you have about testicular cancer.  Practice safe sex. Use condoms and avoid high-risk sexual practices to reduce the spread of sexually transmitted infections (STIs).  You should be screened for STIs, including gonorrhea and chlamydia if:  You are sexually active and are younger than 24 years.  You   are older than 24 years, and your health care provider tells you that you are at risk for this type of infection.  Your sexual activity has changed since you were last screened, and you are at an increased risk for chlamydia or gonorrhea. Ask your health care provider if you are at risk.  If you are at risk of being infected with HIV, it is recommended that you take a prescription medicine daily to prevent HIV infection. This is called pre-exposure prophylaxis (PrEP). You are considered at risk if:  You are a man who has sex with other men (MSM).  You are a heterosexual man who is sexually active with multiple partners.  You take drugs by injection.  You are sexually active with a partner who has HIV.  Talk with your health care provider about whether you are at high risk of being infected with HIV. If you choose to begin PrEP, you should first be tested for HIV. You should then be tested every 3 months for as long as you are taking PrEP.  Use sunscreen. Apply  sunscreen liberally and repeatedly throughout the day. You should seek shade when your shadow is shorter than you. Protect yourself by wearing long sleeves, pants, a wide-brimmed hat, and sunglasses year round whenever you are outdoors.  Tell your health care provider of new moles or changes in moles, especially if there is a change in shape or color. Also, tell your health care provider if a mole is larger than the size of a pencil eraser.  A one-time screening for abdominal aortic aneurysm (AAA) and surgical repair of large AAAs by ultrasound is recommended for men aged 65-75 years who are current or former smokers.  Stay current with your vaccines (immunizations). This information is not intended to replace advice given to you by your health care provider. Make sure you discuss any questions you have with your health care provider. Document Released: 01/01/2008 Document Revised: 07/26/2014 Document Reviewed: 04/08/2015 Elsevier Interactive Patient Education  2017 Elsevier Inc.  American Heart Association (AHA) Exercise Recommendation  Being physically active is important to prevent heart disease and stroke, the nation's No. 1and No. 5killers. To improve overall cardiovascular health, we suggest at least 150 minutes per week of moderate exercise or 75 minutes per week of vigorous exercise (or a combination of moderate and vigorous activity). Thirty minutes a day, five times a week is an easy goal to remember. You will also experience benefits even if you divide your time into two or three segments of 10 to 15 minutes per day.  For people who would benefit from lowering their blood pressure or cholesterol, we recommend 40 minutes of aerobic exercise of moderate to vigorous intensity three to four times a week to lower the risk for heart attack and stroke.  Physical activity is anything that makes you move your body and burn calories.  This includes things like climbing stairs or playing sports.  Aerobic exercises benefit your heart, and include walking, jogging, swimming or biking. Strength and stretching exercises are best for overall stamina and flexibility.  The simplest, positive change you can make to effectively improve your heart health is to start walking. It's enjoyable, free, easy, social and great exercise. A walking program is flexible and boasts high success rates because people can stick with it. It's easy for walking to become a regular and satisfying part of life.   For Overall Cardiovascular Health:  At least 30 minutes of moderate-intensity aerobic activity at least   5 days per week for a total of 150  OR   At least 25 minutes of vigorous aerobic activity at least 3 days per week for a total of 75 minutes; or a combination of moderate- and vigorous-intensity aerobic activity  AND   Moderate- to high-intensity muscle-strengthening activity at least 2 days per week for additional health benefits.  For Lowering Blood Pressure and Cholesterol  An average 40 minutes of moderate- to vigorous-intensity aerobic activity 3 or 4 times per week  What if I can't make it to the time goal? Something is always better than nothing! And everyone has to start somewhere. Even if you've been sedentary for years, today is the day you can begin to make healthy changes in your life. If you don't think you'll make it for 30 or 40 minutes, set a reachable goal for today. You can work up toward your overall goal by increasing your time as you get stronger. Don't let all-or-nothing thinking rob you of doing what you can every day.  Source:http://www.heart.org    

## 2016-09-03 LAB — TSH: TSH: 0.01 m[IU]/L — AB (ref 0.40–4.50)

## 2016-09-03 NOTE — Addendum Note (Signed)
Addended by: Reva BoresPRATT, TANYA S on: 09/03/2016 06:38 PM   Modules accepted: Orders

## 2016-09-06 ENCOUNTER — Other Ambulatory Visit: Payer: BLUE CROSS/BLUE SHIELD

## 2016-09-08 ENCOUNTER — Other Ambulatory Visit: Payer: BLUE CROSS/BLUE SHIELD

## 2016-09-08 DIAGNOSIS — R945 Abnormal results of liver function studies: Secondary | ICD-10-CM

## 2016-09-08 DIAGNOSIS — R7989 Other specified abnormal findings of blood chemistry: Secondary | ICD-10-CM

## 2016-09-08 LAB — T4, FREE: Free T4: 2.6 ng/dL — ABNORMAL HIGH (ref 0.8–1.8)

## 2016-09-08 LAB — T3, FREE: T3, Free: 9.4 pg/mL — ABNORMAL HIGH (ref 2.3–4.2)

## 2016-09-09 LAB — HEPATITIS B SURFACE ANTIGEN: HEP B S AG: NEGATIVE

## 2016-09-09 LAB — HEPATITIS C ANTIBODY: HCV Ab: NEGATIVE

## 2016-09-09 NOTE — Telephone Encounter (Signed)
LM for patient letting him know that the new FMLA papers have been completed and placed up front.  Also informed him of his need for endocrinology referral and to let me know if he wants to go forward with this. Gerrard Crystal,CMA

## 2016-09-09 NOTE — Telephone Encounter (Signed)
-----   Message from Reva Boresanya S Pratt, MD sent at 09/09/2016  8:41 AM EST ----- His Thyroid appears to be overactive by a lot--I think he should see an Endocrinologist

## 2016-09-20 ENCOUNTER — Other Ambulatory Visit: Payer: Self-pay | Admitting: Family Medicine

## 2016-09-20 DIAGNOSIS — E0590 Thyrotoxicosis, unspecified without thyrotoxic crisis or storm: Secondary | ICD-10-CM | POA: Insufficient documentation

## 2016-09-20 NOTE — Progress Notes (Signed)
Referral to endocrinology due to labs.

## 2016-09-20 NOTE — Telephone Encounter (Signed)
Patient informed me that he is fine with going to endocrinology and would like this referral placed.  Informed him that he would hear next from the endocrinology office for an appointment . Jazmin Hartsell,CMA

## 2016-09-30 ENCOUNTER — Encounter: Payer: BLUE CROSS/BLUE SHIELD | Admitting: Family Medicine

## 2016-11-29 ENCOUNTER — Other Ambulatory Visit: Payer: Self-pay | Admitting: *Deleted

## 2016-11-29 MED ORDER — IBUPROFEN 800 MG PO TABS: 800.0000 mg | ORAL_TABLET | Freq: Three times a day (TID) | ORAL | 4 refills | Status: DC | PRN

## 2017-02-08 ENCOUNTER — Encounter (HOSPITAL_COMMUNITY): Payer: Self-pay | Admitting: Emergency Medicine

## 2017-02-08 ENCOUNTER — Ambulatory Visit (HOSPITAL_COMMUNITY)
Admission: EM | Admit: 2017-02-08 | Discharge: 2017-02-08 | Disposition: A | Discharge status: Home / Self Care | Payer: BLUE CROSS/BLUE SHIELD | Attending: Family Medicine | Admitting: Family Medicine

## 2017-02-08 DIAGNOSIS — R0602 Shortness of breath: Secondary | ICD-10-CM | POA: Diagnosis not present

## 2017-02-08 DIAGNOSIS — I10 Essential (primary) hypertension: Secondary | ICD-10-CM

## 2017-02-08 DIAGNOSIS — R0789 Other chest pain: Secondary | ICD-10-CM

## 2017-02-08 NOTE — ED Triage Notes (Signed)
Pt c/o SOB and dyspnea onset yest am associated w/chest tightness... sts episodes will last for an hour  No hx of pulmonary disease  Denies cold sx.   A&O x4... NAD... Ambulatory

## 2017-02-08 NOTE — ED Provider Notes (Signed)
CSN: [id number]     Arrival date & time 02/08/17  1111 History   None    Chief Complaint  Patient presents with  . Shortness of Breath   (Consider location/radiation/quality/duration/timing/severity/associated sxs/prior Treatment) 46 year old male with history of hypertension, never smoker comes in with episodes of shortness of breath with chest tightness. Patient states he's had 2 episodes in the past month and half, where he gets short of breath and chest tightness for about an hour. Worse when laying down. Patient states first episode he was sitting and resting when symptoms started, second episode he woke up with these symptoms. He states started feeling anxious because he is having shortness of breath, denies history of panic attacks, anxiety back in place starting the symptoms. He works at [employer], and has felt some chest pain occasionally carrying heavy boxes, however without chest pain when he works out at [employer] gym. His hypertension is well controlled. Mother has history of congestive heart failure, no family history of heart attacks. Denies GERD symptoms such as heartburn, worsening of symptoms with food. Denies history of asthma, wheezing. Denies URI symptoms such as fever, cough, nasal congestion.      Past Medical History:  Diagnosis Date  . Hypertension   . Vertigo    History reviewed. No pertinent surgical history. Family History  Problem Relation Age of Onset  . Diabetes Mother   . Heart disease Mother   . Hypertension Mother   . Hyperlipidemia Father   . Cancer Father [id number]       prostate  . Hypertension Maternal Uncle   . Diabetes Maternal Uncle   . Cancer Paternal Uncle [id number]       Prostate   Social History  Substance Use Topics  . Smoking status: Never Smoker  . Smokeless tobacco: Never Used  . Alcohol use Yes     Comment: 2-4 glasses/month    Review of Systems  Reason unable to perform ROS: See history of present illness as above.    Allergies  Patient has no  known allergies.  Home Medications   Prior to Admission medications   Medication Sig Start Date End Date Taking? Authorizing Provider  lisinopril-hydrochlorothiazide (PRINZIDE,ZESTORETIC) 20-12.5 MG tablet TAKE 1 TABLET BY MOUTH DAILY. 05/30/16  Yes Reva BoresPratt, Tanya S, MD  meclizine (ANTIVERT) 25 MG tablet Take 1 tablet (25 mg total) by mouth 3 (three) times daily. 08/10/16  Yes Draper, Marcial Pacasimothy R, DO  fluticasone (FLONASE) 50 MCG/ACT nasal spray PLACE 2 SPRAYS INTO BOTH NOSTRILS DAILY. 08/20/15   Reva BoresPratt, Tanya S, MD  ibuprofen (ADVIL,MOTRIN) 800 MG tablet Take 1 tablet (800 mg total) by mouth every 8 (eight) hours as needed. 11/29/16   Enid BaasFields, Karl, MD  loratadine (CLARITIN) 10 MG tablet Take 1 tablet (10 mg total) by mouth daily. Patient not taking: Reported on 09/02/2016 06/30/15   Araceli Boucheumley, Gilman N, DO   Meds Ordered and Administered this Visit  Medications - No data to display  BP 113/77 (BP Location: Left Arm)   Pulse 63   Temp 98.2 F (36.8 C) (Oral)   Resp 20   SpO2 97%  No data found.   Physical Exam  Constitutional: He is oriented to person, place, and time. He appears well-developed and well-nourished. No distress.  HENT:  Head: Normocephalic and atraumatic.  Right Ear: Tympanic membrane, external ear and ear canal normal. Tympanic membrane is not erythematous and not bulging.  Left Ear: Tympanic membrane, external ear and ear canal normal. Tympanic membrane is not erythematous  and not bulging.  Nose: Nose normal. Right sinus exhibits no maxillary sinus tenderness and no frontal sinus tenderness. Left sinus exhibits no maxillary sinus tenderness and no frontal sinus tenderness.  Mouth/Throat: Uvula is midline, oropharynx is clear and moist and mucous membranes are normal.  Eyes: Pupils are equal, round, and reactive to light. Conjunctivae are normal.  Neck: Normal range of motion. Neck supple.  Cardiovascular: Normal rate, regular rhythm and normal heart sounds.  Exam reveals no  gallop and no friction rub.   No murmur heard. Pulmonary/Chest: Effort normal and breath sounds normal. No respiratory distress. He has no decreased breath sounds. He has no wheezes. He has no rhonchi. He has no rales. He exhibits no tenderness.  Lymphadenopathy:    He has no cervical adenopathy.  Neurological: He is alert and oriented to person, place, and time.  Skin: Skin is warm and dry.  Psychiatric: He has a normal mood and affect. His behavior is normal. Judgment normal.    Urgent Care Course     Procedures (including critical care time)  Labs Review Labs Reviewed - No data to display  Imaging Review No results found.   EKG: Normal sinus rhythm, rate at 64 bpm, normal axis, no ST changes.     MDM   1. Shortness of breath    Discussed with patient, history inconsistent with angina, given normal exam today and EKG with normal sinus rhythm and no ST changes, low suspicion for cardiac causes. Patient has appointment with his PCP in 2 days, recommend follow-up for further evaluation as needed. Patient to monitor for any consistent elements for possible causes of his episodes to help with evaluation. Patient to monitor for worsening of symptoms, chest pain, no numbness, tingling, to go to the ED for further evaluation.   Belinda Fisher, PA-C 02/08/17 1201

## 2017-02-08 NOTE — Discharge Instructions (Signed)
Your exam was normal today. EKG was normal as well. History and exam inconsistent with cardiac causes as of right now. Follow-up with your PCP as scheduled. Monitor your episodes, document any possible consistent causes.

## 2017-02-10 ENCOUNTER — Ambulatory Visit: Payer: BLUE CROSS/BLUE SHIELD | Admitting: Family Medicine

## 2017-05-19 ENCOUNTER — Other Ambulatory Visit: Payer: Self-pay | Admitting: Family Medicine

## 2017-05-24 ENCOUNTER — Other Ambulatory Visit: Payer: Self-pay | Admitting: Family Medicine

## 2017-08-29 ENCOUNTER — Other Ambulatory Visit: Payer: Self-pay | Admitting: *Deleted

## 2017-08-29 MED ORDER — IBUPROFEN 800 MG PO TABS: 800.0000 mg | ORAL_TABLET | Freq: Three times a day (TID) | ORAL | 4 refills | Status: DC | PRN

## 2017-08-29 MED ORDER — MECLIZINE HCL 25 MG PO TABS: 25.0000 mg | ORAL_TABLET | Freq: Three times a day (TID) | ORAL | 3 refills | Status: DC

## 2017-11-16 ENCOUNTER — Ambulatory Visit: Payer: BLUE CROSS/BLUE SHIELD | Admitting: Internal Medicine

## 2017-11-23 ENCOUNTER — Other Ambulatory Visit: Payer: Self-pay

## 2017-11-23 ENCOUNTER — Ambulatory Visit: Payer: BLUE CROSS/BLUE SHIELD | Admitting: Family Medicine

## 2017-11-23 MED ORDER — IBUPROFEN 800 MG PO TABS: 800.0000 mg | ORAL_TABLET | Freq: Three times a day (TID) | ORAL | 4 refills | Status: DC | PRN

## 2017-11-23 NOTE — Telephone Encounter (Signed)
Pt called requesting a refill on  ibuprofen. Ok, per pcp, to send to pts pharmacy.

## 2018-02-09 ENCOUNTER — Ambulatory Visit (INDEPENDENT_AMBULATORY_CARE_PROVIDER_SITE_OTHER): Payer: BLUE CROSS/BLUE SHIELD | Admitting: Family Medicine

## 2018-02-09 ENCOUNTER — Encounter: Payer: Self-pay | Admitting: Family Medicine

## 2018-02-09 ENCOUNTER — Other Ambulatory Visit: Payer: Self-pay

## 2018-02-09 VITALS — BP 132/80 | HR 57 | Temp 98.4°F | Wt 208.0 lb

## 2018-02-09 DIAGNOSIS — E0590 Thyrotoxicosis, unspecified without thyrotoxic crisis or storm: Secondary | ICD-10-CM

## 2018-02-09 DIAGNOSIS — I10 Essential (primary) hypertension: Secondary | ICD-10-CM

## 2018-02-09 DIAGNOSIS — K5901 Slow transit constipation: Secondary | ICD-10-CM | POA: Diagnosis not present

## 2018-02-09 DIAGNOSIS — F439 Reaction to severe stress, unspecified: Secondary | ICD-10-CM

## 2018-02-09 DIAGNOSIS — R0602 Shortness of breath: Secondary | ICD-10-CM

## 2018-02-09 MED ORDER — CALCIUM POLYCARBOPHIL 625 MG PO TABS: 625.0000 mg | ORAL_TABLET | Freq: Every day | ORAL | 3 refills | Status: DC

## 2018-02-09 MED ORDER — SENNA-DOCUSATE SODIUM 8.6-50 MG PO TABS: 1.0000 | ORAL_TABLET | Freq: Every day | ORAL | 3 refills | Status: DC

## 2018-02-09 NOTE — Assessment & Plan Note (Addendum)
Tested positive last year--advised to f/u with endocrinology, not sure where the ball was dropped. Will repeat labs and re-refer.

## 2018-02-09 NOTE — Progress Notes (Signed)
   Subjective:    Patient ID: Frank Vargas is a 47 y.o. male presenting with Constipation and Shortness of Breath  on 02/09/2018  HPI: Reports constipation. Has long history of this. If does not take laxative, cannot go. Has cut back on red meat. Does not eat enough vegetables. Has added broccoli. Still not going. Eats apples some times. Taking a laxative daily. Feeling SOB. Started last summer. Felt like having to take deep breaths lasted for 1 hour. Maybe led to an anxiety attack. Went to Urgent care and noted to have negative w/u with EKG only. Now having some more episodes of this. Has h/o claustrophobia. Notes some fast heart rate. Feels SOB and awakens him at night, improves with sitting up or moving around. Has had increasing stress lately and all the time. Continues to work 2 jobs and is busy at home with wife and kids.  Review of Systems  Constitutional: Negative for chills and fever.  Respiratory: Positive for shortness of breath. Negative for cough.   Cardiovascular: Negative for leg swelling.  Gastrointestinal: Positive for constipation. Negative for abdominal pain, nausea and vomiting.  Psychiatric/Behavioral: Negative for suicidal ideas. The patient is nervous/anxious.       Objective:    BP 132/80   Pulse (!) 57   Temp 98.4 F (36.9 C) (Oral)   Wt 208 lb (94.3 kg)   SpO2 98%   BMI 32.58 kg/m  Physical Exam  Constitutional: He appears well-developed and well-nourished. No distress.  HENT:  Head: Normocephalic and atraumatic.  Eyes: No scleral icterus.  Neck: Neck supple.  Cardiovascular: Normal rate.  Pulmonary/Chest: Effort normal.  Abdominal: Soft.  Musculoskeletal: He exhibits no edema.  Neurological: He is alert.  Skin: Skin is warm.  Psychiatric: He has a normal mood and affect.  Vitals reviewed.       Assessment & Plan:   Problem List Items Addressed This Visit      Unprioritized   HYPERTENSION, BENIGN    BP is ok on Prinizide--continue        Hyperthyroidism    Tested positive last year--advised to f/u with endocrinology, not sure where the ball was dropped. Will repeat labs and re-refer.      Relevant Orders   Ambulatory referral to Endocrinology   TSH   T3, free   T4, free   Slow transit constipation    Stop laxative. Add fiber and peri-colace. See if this improves. Increase water.       Relevant Medications   sennosides-docusate sodium (SENOKOT-S) 8.6-50 MG tablet   polycarbophil (FIBERCON) 625 MG tablet   Shortness of breath - Primary    O2 sats are normal. Breath sounds are normal. Will check CXR to r/o pathology like sarcoid...      Relevant Orders   DG Chest 2 View   CBC   Comprehensive metabolic panel   Stress    Have advised to work on this. He needs to slow down, improve sleep habits. Impact of stress on overall health discussed at length today.         Total face-to-face time with patient: 25 minutes. Over 50% of encounter was spent on counseling and coordination of care. Return in about 1 month (around 03/09/2018).  Reva Boresanya S Luisfernando Brightwell 02/09/2018 3:46 PM

## 2018-02-09 NOTE — Assessment & Plan Note (Signed)
O2 sats are normal. Breath sounds are normal. Will check CXR to r/o pathology like sarcoid.Marland Kitchen..Marland Kitchen

## 2018-02-09 NOTE — Assessment & Plan Note (Signed)
BP is ok on Prinizide--continue

## 2018-02-09 NOTE — Patient Instructions (Signed)

## 2018-02-09 NOTE — Assessment & Plan Note (Signed)
Have advised to work on this. He needs to slow down, improve sleep habits. Impact of stress on overall health discussed at length today.

## 2018-02-09 NOTE — Assessment & Plan Note (Addendum)
Stop laxative. Add fiber and peri-colace. See if this improves. Increase water.

## 2018-02-10 ENCOUNTER — Ambulatory Visit
Admission: RE | Admit: 2018-02-10 | Discharge: 2018-02-10 | Disposition: A | Discharge status: Home / Self Care | Payer: BLUE CROSS/BLUE SHIELD | Source: Ambulatory Visit | Attending: Family Medicine | Admitting: Family Medicine

## 2018-02-10 ENCOUNTER — Encounter: Payer: Self-pay | Admitting: Family Medicine

## 2018-02-10 DIAGNOSIS — R0602 Shortness of breath: Secondary | ICD-10-CM

## 2018-02-10 LAB — COMPREHENSIVE METABOLIC PANEL
A/G RATIO: 1.5 (ref 1.2–2.2)
ALT: 43 IU/L (ref 0–44)
AST: 41 IU/L — AB (ref 0–40)
Albumin: 4.1 g/dL (ref 3.5–5.5)
Alkaline Phosphatase: 71 IU/L (ref 39–117)
BILIRUBIN TOTAL: 0.2 mg/dL (ref 0.0–1.2)
BUN/Creatinine Ratio: 12 (ref 9–20)
BUN: 17 mg/dL (ref 6–24)
CALCIUM: 9.3 mg/dL (ref 8.7–10.2)
CHLORIDE: 101 mmol/L (ref 96–106)
CO2: 27 mmol/L (ref 20–29)
Creatinine, Ser: 1.45 mg/dL — ABNORMAL HIGH (ref 0.76–1.27)
GFR, EST AFRICAN AMERICAN: 67 mL/min/{1.73_m2} (ref >59)
GFR, EST NON AFRICAN AMERICAN: 58 mL/min/{1.73_m2} — AB (ref >59)
GLOBULIN, TOTAL: 2.7 g/dL (ref 1.5–4.5)
Glucose: 82 mg/dL (ref 65–99)
Potassium: 3.9 mmol/L (ref 3.5–5.2)
SODIUM: 141 mmol/L (ref 134–144)
Total Protein: 6.8 g/dL (ref 6.0–8.5)

## 2018-02-10 LAB — T4, FREE: Free T4: 1.46 ng/dL (ref 0.82–1.77)

## 2018-02-10 LAB — TSH

## 2018-02-10 LAB — CBC
HEMATOCRIT: 43.8 % (ref 37.5–51.0)
Hemoglobin: 14.9 g/dL (ref 13.0–17.7)
MCH: 29.5 pg (ref 26.6–33.0)
MCHC: 34 g/dL (ref 31.5–35.7)
MCV: 87 fL (ref 79–97)
PLATELETS: 268 10*3/uL (ref 150–450)
RBC: 5.05 x10E6/uL (ref 4.14–5.80)
RDW: 14.5 % (ref 12.3–15.4)
WBC: 6.5 10*3/uL (ref 3.4–10.8)

## 2018-02-10 LAB — T3, FREE: T3, Free: 3.9 pg/mL (ref 2.0–4.4)

## 2018-03-08 ENCOUNTER — Encounter: Payer: Self-pay | Admitting: Family Medicine

## 2018-03-08 ENCOUNTER — Ambulatory Visit: Payer: BLUE CROSS/BLUE SHIELD | Admitting: Family Medicine

## 2018-03-08 VITALS — BP 110/70 | HR 60 | Temp 98.4°F | Ht 67.0 in | Wt 209.0 lb

## 2018-03-08 DIAGNOSIS — R0602 Shortness of breath: Secondary | ICD-10-CM | POA: Diagnosis not present

## 2018-03-08 DIAGNOSIS — K5901 Slow transit constipation: Secondary | ICD-10-CM

## 2018-03-08 DIAGNOSIS — E0590 Thyrotoxicosis, unspecified without thyrotoxic crisis or storm: Secondary | ICD-10-CM

## 2018-03-08 DIAGNOSIS — R799 Abnormal finding of blood chemistry, unspecified: Secondary | ICD-10-CM | POA: Diagnosis not present

## 2018-03-08 DIAGNOSIS — R7989 Other specified abnormal findings of blood chemistry: Secondary | ICD-10-CM

## 2018-03-08 MED ORDER — POLYETHYLENE GLYCOL 3350 17 G PO PACK: 17.0000 g | PACK | Freq: Every day | ORAL | 0 refills | Status: DC

## 2018-03-08 NOTE — Progress Notes (Signed)
   Subjective:    Patient ID: Frank Vargas is a 47 y.o. male presenting with Shortness of Breath (breathing has improved) and Constipation  on 03/08/2018  HPI: Still with constipation, despite fibercon and Senna. Feels like his SOB is better. May have some anxiety. Exercising more and has gained one pound since last visit. He is also taking a laxative daily. Would like to see GI since he is doubling his fiber and is not improving.  He is pretty sure his SOB is related to panic attacks. He continues to work 2 jobs and does not get a lot of sleep. Has not gotten referral to endocrinology. Labs show markedly suppressed TSH, but now normal Free T3 and Free T4 (previously abnl).  Review of Systems  Constitutional: Negative for chills and fever.  Respiratory: Negative for shortness of breath.   Cardiovascular: Negative for leg swelling.  Gastrointestinal: Negative for abdominal pain, nausea and vomiting.      Objective:    BP 110/70   Pulse 60   Temp 98.4 F (36.9 C) (Oral)   Ht 5\' 7"  (1.702 m)   Wt 209 lb (94.8 kg)   SpO2 98%   BMI 32.73 kg/m  Physical Exam  Constitutional: He appears well-developed and well-nourished. No distress.  HENT:  Head: Normocephalic and atraumatic.  Eyes: No scleral icterus.  Neck: Neck supple.  Cardiovascular: Normal rate.  Pulmonary/Chest: Effort normal.  Abdominal: Soft.  Musculoskeletal: He exhibits no edema.  Neurological: He is alert.  Skin: Skin is warm.  Psychiatric: He has a normal mood and affect.  Vitals reviewed.      Assessment & Plan:   Problem List Items Addressed This Visit      Unprioritized   Hyperthyroidism    To get f/u appointment with [facility] endocr[facility]      Slow transit constipation - Primary    Referral to GI. Continue Senna and Fibercon--add miralax twice weekly as needed.      Relevant Medications   polyethylene glycol (MIRALAX / GLYCOLAX) packet   Other Relevant Orders   Ambulatory referral to  Gastroenterology   Shortness of breath    Likely stress related.       Other Visit Diagnoses    Abnormal serum creatinine level       Relevant Orders   Comprehensive metabolic panel (Completed)      Total face-to-face time with patient: 15 minutes. Over 50% of encounter was spent on counseling and coordination of care. Return in about 4 weeks (around 04/05/2018).  Reva Boresanya S Pratt 03/08/2018 4:17 PM

## 2018-03-08 NOTE — Patient Instructions (Signed)

## 2018-03-09 ENCOUNTER — Encounter: Payer: Self-pay | Admitting: Gastroenterology

## 2018-03-09 ENCOUNTER — Encounter: Payer: Self-pay | Admitting: Family Medicine

## 2018-03-09 LAB — COMPREHENSIVE METABOLIC PANEL
ALBUMIN: 3.9 g/dL (ref 3.5–5.5)
ALT: 40 IU/L (ref 0–44)
AST: 24 IU/L (ref 0–40)
Albumin/Globulin Ratio: 1.5 (ref 1.2–2.2)
Alkaline Phosphatase: 91 IU/L (ref 39–117)
BUN / CREAT RATIO: 12 (ref 9–20)
BUN: 14 mg/dL (ref 6–24)
CALCIUM: 9.7 mg/dL (ref 8.7–10.2)
CO2: 29 mmol/L (ref 20–29)
Chloride: 101 mmol/L (ref 96–106)
Creatinine, Ser: 1.18 mg/dL (ref 0.76–1.27)
GFR, EST AFRICAN AMERICAN: 86 mL/min/{1.73_m2} (ref >59)
GFR, EST NON AFRICAN AMERICAN: 74 mL/min/{1.73_m2} (ref >59)
GLUCOSE: 114 mg/dL — AB (ref 65–99)
Globulin, Total: 2.6 g/dL (ref 1.5–4.5)
Potassium: 4.5 mmol/L (ref 3.5–5.2)
Sodium: 140 mmol/L (ref 134–144)
TOTAL PROTEIN: 6.5 g/dL (ref 6.0–8.5)

## 2018-03-09 NOTE — Assessment & Plan Note (Signed)
To get f/u appointment with [facility] end[facility]

## 2018-03-09 NOTE — Assessment & Plan Note (Signed)
Referral to GI. Continue Senna and Fibercon--add miralax twice weekly as needed.

## 2018-03-09 NOTE — Assessment & Plan Note (Signed)
Likely stress related.

## 2018-04-25 ENCOUNTER — Ambulatory Visit: Payer: BLUE CROSS/BLUE SHIELD | Admitting: Gastroenterology

## 2018-04-25 ENCOUNTER — Encounter: Payer: Self-pay | Admitting: Gastroenterology

## 2018-04-25 VITALS — BP 120/80 | HR 76 | Ht 67.0 in | Wt 203.0 lb

## 2018-04-25 DIAGNOSIS — K5900 Constipation, unspecified: Secondary | ICD-10-CM | POA: Diagnosis not present

## 2018-04-25 MED ORDER — PEG 3350-KCL-NA BICARB-NACL 420 G PO SOLR: 4000.0000 mL | ORAL | 0 refills | Status: DC

## 2018-04-25 NOTE — Patient Instructions (Addendum)
You will be set up for a colonoscopy for constipation. For now: Please start taking citrucel (orange flavored) powder fiber supplement.  This may cause some bloating at first but that usually goes away. Begin with a small spoonful and work your way up to a large, heaping spoonful daily over a week. Also start miralax powder, one dose once daily.  You have been scheduled for a colonoscopy. Please follow written instructions given to you at your visit today.  Please pick up your prep supplies at the pharmacy within the next 1-3 days. If you use inhalers (even only as needed), please bring them with you on the day of your procedure. Your physician has requested that you go to www.startemmi.com and enter the access code given to you at your visit today. This web site gives a general overview about your procedure. However, you should still follow specific instructions given to you by our office regarding your preparation for the procedure.  Thank you for entrusting me with your care and choosing [facility].  Dr Christella Hartigan

## 2018-04-25 NOTE — Progress Notes (Signed)
HPI: This is a very pleasant 47 year old man who was referred to me by Reva Bores, MD  to evaluate chronic constipation.    Chief complaint is chronic constipation  He has been constipated for about 20 years getting worse. Can go 7-10 days without a BM.  When he does he has to really push and strain.  He has seen a very small amount of blood on a few occasions but this is not common.  This is usually when he was really pushing and straining.  Dulcolax didn't work.  He has not tried miralax.  Senna and fibercon recently: take both every day. No real improvement on that after several weeks.  +/- at staying hydrating.  Takes ibuprofen 2-3 times per week.  Thinks he is losing weight.  No FH of colon cancer.  Father with prostate cancer.   Old Data Reviewed:  Labs August 2019 normal complete metabolic profile, cbc, TSH very low but free T3 and T4 were normal     Review of systems: Pertinent positive and negative review of systems were noted in the above HPI section. All other review negative.   Past Medical History:  Diagnosis Date  . Hypertension   . Vertigo     No past surgical history on file.  Current Outpatient Medications  Medication Sig Dispense Refill  . fluticasone (FLONASE) 50 MCG/ACT nasal spray PLACE 2 SPRAYS INTO BOTH NOSTRILS DAILY. 16 g 3  . ibuprofen (ADVIL,MOTRIN) 800 MG tablet Take 1 tablet (800 mg total) by mouth every 8 (eight) hours as needed. 60 tablet 4  . lisinopril-hydrochlorothiazide (PRINZIDE,ZESTORETIC) 20-12.5 MG tablet TAKE 1 TABLET BY MOUTH DAILY. 90 tablet 3  . meclizine (ANTIVERT) 25 MG tablet Take 1 tablet (25 mg total) by mouth 3 (three) times daily. 30 tablet 3  . polycarbophil (FIBERCON) 625 MG tablet Take 1 tablet (625 mg total) by mouth daily. 90 tablet 3  . sennosides-docusate sodium (SENOKOT-S) 8.6-50 MG tablet Take 1 tablet by mouth daily. 90 tablet 3   No current facility-administered medications for this visit.      Allergies as of 04/25/2018  . (No Known Allergies)    Family History  Problem Relation Age of Onset  . Diabetes Mother   . Heart disease Mother   . Hypertension Mother   . Hyperlipidemia Father   . Cancer Father 5       prostate  . Hypertension Maternal Uncle   . Diabetes Maternal Uncle   . Cancer Paternal Uncle 91       Prostate    Social History   Socioeconomic History  . Marital status: Married    Spouse name: Chesley Veasey  . Number of children: 3  . Years of education: Not on file  . Highest education level: Not on file  Occupational History    Employer: [employer]  . Occupation: Pre-K Programmer, systems    Comment: [place]  Social Needs  . Financial resource strain: Not on file  . Food insecurity:    Worry: Not on file    Inability: Not on file  . Transportation needs:    Medical: Not on file    Non-medical: Not on file  Tobacco Use  . Smoking status: Never Smoker  . Smokeless tobacco: Never Used  Substance and Sexual Activity  . Alcohol use: Yes    Comment: 2-4 glasses/month  . Drug use: Not on file  . Sexual activity: Yes  Lifestyle  . Physical activity:    Days per  week: Not on file    Minutes per session: Not on file  . Stress: Not on file  Relationships  . Social connections:    Talks on phone: Not on file    Gets together: Not on file    Attends religious service: Not on file    Active member of club or organization: Not on file    Attends meetings of clubs or organizations: Not on file    Relationship status: Not on file  . Intimate partner violence:    Fear of current or ex partner: Not on file    Emotionally abused: Not on file    Physically abused: Not on file    Forced sexual activity: Not on file  Other Topics Concern  . Not on file  Social History Narrative  . Not on file     Physical Exam: BP 120/80   Pulse 76   Ht 5\' 7"  (1.702 m)   Wt 203 lb (92.1 kg)   BMI 31.79 kg/m  Constitutional: generally  well-appearing Psychiatric: alert and oriented x3 Eyes: extraocular movements intact Mouth: oral pharynx moist, no lesions Neck: supple no lymphadenopathy Cardiovascular: heart regular rate and rhythm Lungs: clear to auscultation bilaterally Abdomen: soft, nontender, nondistended, no obvious ascites, no peritoneal signs, normal bowel sounds Extremities: no lower extremity edema bilaterally Skin: no lesions on visible extremities   Assessment and plan: 47 y.o. male with chronic constipation  We discussed that he will start taking Citrucel powder fiber supplement as well as a single dose of MiraLAX both on a daily basis for now.  I recommended a colonoscopy to exclude structural causes which I explained is unlikely to be the case.  I see no reason for any blood tests or imaging studies prior to then.    Please see the "Patient Instructions" section for addition details about the plan.   Rob Bunting, MD [facility] 04/25/2018, 3:02 PM  Cc: Reva Bores, MD

## 2018-05-04 ENCOUNTER — Encounter: Payer: BLUE CROSS/BLUE SHIELD | Admitting: Gastroenterology

## 2018-05-05 ENCOUNTER — Other Ambulatory Visit: Payer: Self-pay | Admitting: Family Medicine

## 2018-05-17 ENCOUNTER — Ambulatory Visit: Payer: BLUE CROSS/BLUE SHIELD

## 2018-05-17 ENCOUNTER — Ambulatory Visit (INDEPENDENT_AMBULATORY_CARE_PROVIDER_SITE_OTHER): Payer: BLUE CROSS/BLUE SHIELD | Admitting: Endocrinology

## 2018-05-17 ENCOUNTER — Encounter: Payer: Self-pay | Admitting: Endocrinology

## 2018-05-17 VITALS — BP 114/64 | HR 62 | Ht 67.0 in | Wt 206.0 lb

## 2018-05-17 DIAGNOSIS — E0590 Thyrotoxicosis, unspecified without thyrotoxic crisis or storm: Secondary | ICD-10-CM | POA: Diagnosis not present

## 2018-05-17 MED ORDER — METHIMAZOLE 10 MG PO TABS: 20.0000 mg | ORAL_TABLET | Freq: Two times a day (BID) | ORAL | 3 refills | Status: DC

## 2018-05-17 NOTE — Patient Instructions (Addendum)
I have sent a prescription to your pharmacy, to slow the thyroid If ever you have fever while taking methimazole, stop it and call us, even if the reason is obvious, because of the risk of a rare side-effect.  Please come back for a follow-up appointment in 3-4 weeks.   In the future, we can consider 1 of the other treatment options.    Hyperthyroidism Hyperthyroidism is when the thyroid is too active (overactive). Your thyroid is a large gland that is located in your neck. The thyroid helps to control how your body uses food (metabolism). When your thyroid is overactive, it produces too much of a hormone called thyroxine. What are the causes? Causes of hyperthyroidism may include:  Graves disease. This is when your immune system attacks the thyroid gland. This is the most common cause.  Inflammation of the thyroid gland.  Tumor in the thyroid gland or somewhere else.  Excessive use of thyroid medicines, including: ? Prescription thyroid supplement. ? Herbal supplements that mimic thyroid hormones.  Solid or fluid-filled lumps within your thyroid gland (thyroid nodules).  Excessive ingestion of iodine.  What increases the risk?  Being male.  Having a family history of thyroid conditions. What are the signs or symptoms? Signs and symptoms of hyperthyroidism may include:  Nervousness.  Inability to tolerate heat.  Unexplained weight loss.  Diarrhea.  Change in the texture of hair or skin.  Heart skipping beats or making extra beats.  Rapid heart rate.  Loss of menstruation.  Shaky hands.  Fatigue.  Restlessness.  Increased appetite.  Sleep problems.  Enlarged thyroid gland or nodules.  How is this diagnosed? Diagnosis of hyperthyroidism may include:  Medical history and physical exam.  Blood tests.  Ultrasound tests.  How is this treated? Treatment may include:  Medicines to control your thyroid.  Surgery to remove your thyroid.  Radiation  therapy.  Follow these instructions at home:  Take medicines only as directed by your health care provider.  Do not use any tobacco products, including cigarettes, chewing tobacco, or electronic cigarettes. If you need help quitting, ask your health care provider.  Do not exercise or do physical activity until your health care provider approves.  Keep all follow-up appointments as directed by your health care provider. This is important. Contact a health care provider if:  Your symptoms do not get better with treatment.  You have fever.  You are taking thyroid replacement medicine and you: ? Have depression. ? Feel mentally and physically slow. ? Have weight gain. Get help right away if:  You have decreased alertness or a change in your awareness.  You have abdominal pain.  You feel dizzy.  You have a rapid heartbeat.  You have an irregular heartbeat. This information is not intended to replace advice given to you by your health care provider. Make sure you discuss any questions you have with your health care provider. Document Released: 07/05/2005 Document Revised: 12/04/2015 Document Reviewed: 11/20/2013 Elsevier Interactive Patient Education  2018 [employer].

## 2018-05-17 NOTE — Progress Notes (Signed)
Subjective:    Patient ID: Frank Vargas, male    DOB: March 22, 1971, 47 y.o.   MRN: [id number]  HPI Pt is referred by Dr Shawnie Pons, for hyperthyroidism.  Pt reports he was dx'ed with hyperthyroidism in 2018.  He has never been on therapy for this.  He has never had XRT to the anterior neck, or thyroid surgery.  He has never had thyroid imaging.  He does not consume kelp or any other non-prescribed thyroid medication.  He has never been on amiodarone.  He reports moderate palpitations in the chest, and assoc fatigue.   Past Medical History:  Diagnosis Date  . Hypertension   . Vertigo     No past surgical history on file.  Social History   Socioeconomic History  . Marital status: Married    Spouse name: Dalvin Clipper  . Number of children: 3  . Years of education: Not on file  . Highest education level: Not on file  Occupational History    Employer: [employer]  . Occupation: Pre-K Programmer, systems    Comment: [place]  Social Needs  . Financial resource strain: Not on file  . Food insecurity:    Worry: Not on file    Inability: Not on file  . Transportation needs:    Medical: Not on file    Non-medical: Not on file  Tobacco Use  . Smoking status: Never Smoker  . Smokeless tobacco: Never Used  Substance and Sexual Activity  . Alcohol use: Yes    Comment: 2-4 glasses/month  . Drug use: Not on file  . Sexual activity: Yes  Lifestyle  . Physical activity:    Days per week: Not on file    Minutes per session: Not on file  . Stress: Not on file  Relationships  . Social connections:    Talks on phone: Not on file    Gets together: Not on file    Attends religious service: Not on file    Active member of club or organization: Not on file    Attends meetings of clubs or organizations: Not on file    Relationship status: Not on file  . Intimate partner violence:    Fear of current or ex partner: Not on file    Emotionally abused: Not on file    Physically abused: Not on file   Forced sexual activity: Not on file  Other Topics Concern  . Not on file  Social History Narrative  . Not on file    Current Outpatient Medications on File Prior to Visit  Medication Sig Dispense Refill  . fluticasone (FLONASE) 50 MCG/ACT nasal spray PLACE 2 SPRAYS INTO BOTH NOSTRILS DAILY. 16 g 3  . ibuprofen (ADVIL,MOTRIN) 800 MG tablet Take 1 tablet (800 mg total) by mouth every 8 (eight) hours as needed. 60 tablet 4  . lisinopril-hydrochlorothiazide (PRINZIDE,ZESTORETIC) 20-12.5 MG tablet TAKE 1 TABLET BY MOUTH DAILY. 90 tablet 3  . meclizine (ANTIVERT) 25 MG tablet Take 1 tablet (25 mg total) by mouth 3 (three) times daily. 30 tablet 3  . polycarbophil (FIBERCON) 625 MG tablet Take 1 tablet (625 mg total) by mouth daily. 90 tablet 3  . polyethylene glycol-electrolytes (NULYTELY/GOLYTELY) 420 g solution Take 4,000 mLs by mouth as directed. 4000 mL 0  . sennosides-docusate sodium (SENOKOT-S) 8.6-50 MG tablet Take 1 tablet by mouth daily. 90 tablet 3   No current facility-administered medications on file prior to visit.     No Known Allergies  Family History  Problem Relation Age of Onset  . Diabetes Mother   . Heart disease Mother   . Hypertension Mother   . Hyperlipidemia Father   . Cancer Father 75       prostate  . Hypertension Maternal Uncle   . Diabetes Maternal Uncle   . Cancer Paternal Uncle 38       Prostate  . Thyroid disease Neg Hx     BP 114/64 (BP Location: Right Arm, Patient Position: Sitting, Cuff Size: Normal)   Pulse 62   Ht 5\' 7"  (1.702 m)   Wt 206 lb (93.4 kg)   SpO2 97%   BMI 32.26 kg/m    Review of Systems denies headache, hoarseness, diplopia, diarrhea, polyuria, edema, excessive diaphoresis, heat intolerance, easy bruising, and rhinorrhea.  He has anxiety, doe, muscle weakness, tremor, and weight gain     Objective:   Physical Exam VS: see vs page GEN: no distress HEAD: head: no deformity eyes: no periorbital swelling, no  proptosis external nose and ears are normal mouth: no lesion seen NECK: thyroid is slightly and diffusely enlarged CHEST WALL: no deformity LUNGS: clear to auscultation CV: reg rate and rhythm, no murmur ABD: abdomen is soft, nontender.  no hepatosplenomegaly.  not distended.  no hernia MUSCULOSKELETAL: muscle bulk and strength are grossly normal.  no obvious joint swelling.  gait is normal and steady EXTEMITIES: no deformity.  no edema PULSES: no carotid bruit NEURO:  cn 2-12 grossly intact.   readily moves all 4's.  sensation is intact to touch on all 4's.  Slight tremor of the hands SKIN:  Normal texture and temperature.  No rash or suspicious lesion is visible.  Not diaphoretic NODES:  None palpable at the neck PSYCH: alert, well-oriented.  Does not appear anxious nor depressed.  I have reviewed outside records, and summarized: Pt was noted to have suppressed TSH, and referred here.  Main sxs were constipation and sob   Lab Results  Component Value Date   TSH <0.006 (L) 02/09/2018       Assessment & Plan:  Hyperthyroidism, new to me.  prob due to Grave's Dz.   Patient Instructions  I have sent a prescription to your pharmacy, to slow the thyroid If ever you have fever while taking methimazole, stop it and call us, even if the reason is obvious, because of the risk of a rare side-effect.  Please come back for a follow-up appointment in 3-4 weeks.   In the future, we can consider 1 of the other treatment options.    Hyperthyroidism Hyperthyroidism is when the thyroid is too active (overactive). Your thyroid is a large gland that is located in your neck. The thyroid helps to control how your body uses food (metabolism). When your thyroid is overactive, it produces too much of a hormone called thyroxine. What are the causes? Causes of hyperthyroidism may include:  Graves disease. This is when your immune system attacks the thyroid gland. This is the most common  cause.  Inflammation of the thyroid gland.  Tumor in the thyroid gland or somewhere else.  Excessive use of thyroid medicines, including: ? Prescription thyroid supplement. ? Herbal supplements that mimic thyroid hormones.  Solid or fluid-filled lumps within your thyroid gland (thyroid nodules).  Excessive ingestion of iodine.  What increases the risk?  Being male.  Having a family history of thyroid conditions. What are the signs or symptoms? Signs and symptoms of hyperthyroidism may include:  Nervousness.  Inability to tolerate heat.  Unexplained weight loss.  Diarrhea.  Change in the texture of hair or skin.  Heart skipping beats or making extra beats.  Rapid heart rate.  Loss of menstruation.  Shaky hands.  Fatigue.  Restlessness.  Increased appetite.  Sleep problems.  Enlarged thyroid gland or nodules.  How is this diagnosed? Diagnosis of hyperthyroidism may include:  Medical history and physical exam.  Blood tests.  Ultrasound tests.  How is this treated? Treatment may include:  Medicines to control your thyroid.  Surgery to remove your thyroid.  Radiation therapy.  Follow these instructions at home:  Take medicines only as directed by your health care provider.  Do not use any tobacco products, including cigarettes, chewing tobacco, or electronic cigarettes. If you need help quitting, ask your health care provider.  Do not exercise or do physical activity until your health care provider approves.  Keep all follow-up appointments as directed by your health care provider. This is important. Contact a health care provider if:  Your symptoms do not get better with treatment.  You have fever.  You are taking thyroid replacement medicine and you: ? Have depression. ? Feel mentally and physically slow. ? Have weight gain. Get help right away if:  You have decreased alertness or a change in your awareness.  You have abdominal  pain.  You feel dizzy.  You have a rapid heartbeat.  You have an irregular heartbeat. This information is not intended to replace advice given to you by your health care provider. Make sure you discuss any questions you have with your health care provider. Document Released: 07/05/2005 Document Revised: 12/04/2015 Document Reviewed: 11/20/2013 Elsevier Interactive Patient Education  2018 [employer].

## 2018-05-31 ENCOUNTER — Ambulatory Visit (AMBULATORY_SURGERY_CENTER): Payer: BLUE CROSS/BLUE SHIELD | Admitting: Gastroenterology

## 2018-05-31 ENCOUNTER — Encounter: Payer: Self-pay | Admitting: Gastroenterology

## 2018-05-31 VITALS — BP 111/61 | HR 73 | Temp 97.3°F | Resp 16 | Ht 67.0 in | Wt 203.0 lb

## 2018-05-31 DIAGNOSIS — K5900 Constipation, unspecified: Secondary | ICD-10-CM | POA: Diagnosis present

## 2018-05-31 MED ORDER — SODIUM CHLORIDE 0.9 % IV SOLN: 500.0000 mL | Freq: Once | INTRAVENOUS | Status: DC

## 2018-05-31 NOTE — Progress Notes (Signed)
To PACU, VSS. Report to [email or web address] 

## 2018-05-31 NOTE — Op Note (Signed)
[facility] Patient Name: Frank Vargas Procedure Date: 05/31/2018 2:38 PM MRN: [id number] Endoscopist: Rachael Feeaniel P  , MD Age: 47 Referring MD:  Date of Birth: [birth year] Gender: Male Account #: [id number] Procedure:                Colonoscopy Indications:              Constipation Medicines:                Monitored Anesthesia Care Procedure:                Pre-Anesthesia Assessment:                           - Prior to the procedure, a History and Physical                            was performed, and patient medications and                            allergies were reviewed. The patient's tolerance of                            previous anesthesia was also reviewed. The risks                            and benefits of the procedure and the sedation                            options and risks were discussed with the patient.                            All questions were answered, and informed consent                            was obtained. Prior Anticoagulants: The patient has                            taken no previous anticoagulant or antiplatelet                            agents. ASA Grade Assessment: II - A patient with                            mild systemic disease. After reviewing the risks                            and benefits, the patient was deemed in                            satisfactory condition to undergo the procedure.                           After obtaining informed consent, the colonoscope  was passed under direct vision. Throughout the                            procedure, the patient's blood pressure, pulse, and                            oxygen saturations were monitored continuously. The                            Colonoscope was introduced through the anus and                            advanced to the the cecum, identified by                            appendiceal orifice and ileocecal valve. The                colonoscopy was performed without difficulty. The                            patient tolerated the procedure well. The quality                            of the bowel preparation was good. The ileocecal                            valve, appendiceal orifice, and rectum were                            photographed. Scope In: 2:44:10 PM Scope Out: 2:52:26 PM Scope Withdrawal Time: 0 hours 6 minutes 36 seconds  Total Procedure Duration: 0 hours 8 minutes 16 seconds  Findings:                 The entire examined colon appeared normal on direct                            and retroflexion views. Complications:            No immediate complications. Estimated blood loss:                            None. Estimated Blood Loss:     Estimated blood loss: none. Impression:               - The entire examined colon is normal on direct and                            retroflexion views.                           - No polyps or cancers. Recommendation:           - Patient has a contact number available for                            emergencies. The signs  and symptoms of potential                            delayed complications were discussed with the                            patient. Return to normal activities tomorrow.                            Written discharge instructions were provided to the                            patient.                           - Resume previous diet.                           - Continue present medications. ((Once daily                            citrucel and once daily miralax))                           - Repeat colonoscopy in 10 years for screening. Rachael Fee, MD 05/31/2018 2:55:03 PM This report has been signed electronically.

## 2018-05-31 NOTE — Patient Instructions (Signed)
YOU HAD AN ENDOSCOPIC PROCEDURE TODAY AT THE [facility]:   Refer to the procedure report that was given to you for any specific questions about what was found during the examination.  If the procedure report does not answer your questions, please call your gastroenterologist to clarify.  If you requested that your care partner not be given the details of your procedure findings, then the procedure report has been included in a sealed envelope for you to review at your convenience later.  YOU SHOULD EXPECT: Some feelings of bloating in the abdomen. Passage of more gas than usual.  Walking can help get rid of the air that was put into your GI tract during the procedure and reduce the bloating. If you had a lower endoscopy (such as a colonoscopy or flexible sigmoidoscopy) you may notice spotting of blood in your stool or on the toilet paper. If you underwent a bowel prep for your procedure, you may not have a normal bowel movement for a few days.  Please Note:  You might notice some irritation and congestion in your nose or some drainage.  This is from the oxygen used during your procedure.  There is no need for concern and it should clear up in a day or so.  SYMPTOMS TO REPORT IMMEDIATELY:   Following lower endoscopy (colonoscopy or flexible sigmoidoscopy):  Excessive amounts of blood in the stool  Significant tenderness or worsening of abdominal pains  Swelling of the abdomen that is new, acute  Fever of 100F or higher  For urgent or emergent issues, a gastroenterologist can be reached at any hour by calling [phone].   DIET:  We do recommend a small meal at first, but then you may proceed to your regular diet.  Drink plenty of fluids but you should avoid alcoholic beverages for 24 hours.  ACTIVITY:  You should plan to take it easy for the rest of today and you should NOT DRIVE or use heavy machinery until tomorrow (because of the sedation medicines used during the test).     FOLLOW UP: Our staff will call the number listed on your records the next business day following your procedure to check on you and address any questions or concerns that you may have regarding the information given to you following your procedure. If we do not reach you, we will leave a message.  However, if you are feeling well and you are not experiencing any problems, there is no need to return our call.  We will assume that you have returned to your regular daily activities without incident.  If any biopsies were taken you will be contacted by phone or by letter within the next 1-3 weeks.  Please call us at [phone] if you have not heard about the biopsies in 3 weeks.    SIGNATURES/CONFIDENTIALITY: You and/or your care partner have signed paperwork which will be entered into your electronic medical record.  These signatures attest to the fact that that the information above on your After Visit Summary has been reviewed and is understood.  Full responsibility of the confidentiality of this discharge information lies with you and/or your care-partner. 

## 2018-06-01 ENCOUNTER — Telehealth: Payer: Self-pay | Admitting: *Deleted

## 2018-06-01 NOTE — Telephone Encounter (Signed)
  Follow up Call-  Call back number 05/31/2018  Post procedure Call Back phone  # [phone]  Permission to leave phone message Yes  Some recent data might be hidden     Patient questions:  Do you have a fever, pain , or abdominal swelling? No. Pain Score  0 *  Have you tolerated food without any problems? Yes.    Have you been able to return to your normal activities? Yes.    Do you have any questions about your discharge instructions: Diet   No. Medications  No. Follow up visit  No.  Do you have questions or concerns about your Care? No.  Actions: * If pain score is 4 or above: No action needed, pain <4.

## 2018-06-21 ENCOUNTER — Encounter: Payer: Self-pay | Admitting: Endocrinology

## 2018-06-21 ENCOUNTER — Ambulatory Visit (INDEPENDENT_AMBULATORY_CARE_PROVIDER_SITE_OTHER): Payer: BLUE CROSS/BLUE SHIELD | Admitting: Endocrinology

## 2018-06-21 VITALS — BP 146/96 | HR 68 | Ht 67.0 in | Wt 212.4 lb

## 2018-06-21 DIAGNOSIS — E0590 Thyrotoxicosis, unspecified without thyrotoxic crisis or storm: Secondary | ICD-10-CM

## 2018-06-21 LAB — TSH: TSH: 0.01 u[IU]/mL — ABNORMAL LOW (ref 0.35–4.50)

## 2018-06-21 LAB — T4, FREE: Free T4: 0.75 ng/dL (ref 0.60–1.60)

## 2018-06-21 MED ORDER — METHIMAZOLE 10 MG PO TABS: 10.0000 mg | ORAL_TABLET | Freq: Two times a day (BID) | ORAL | 3 refills | Status: DC

## 2018-06-21 NOTE — Patient Instructions (Addendum)
Your blood pressure is high today.  Please see your primary care provider soon, to have it rechecked blood tests are requested for you today.  We'll let you know about the results. If ever you have fever while taking methimazole, stop it and call us, even if the reason is obvious, because of the risk of a rare side-effect. Please come back for a follow-up appointment in 2 months.  

## 2018-06-21 NOTE — Progress Notes (Signed)
Subjective:    Patient ID: Frank Vargas, male    DOB: 12-04-1970, 47 y.o.   MRN: [id number]  HPI  Pt returns for f/u of hyperthyroidism; (dx'ed 2018; he has been on tapazole since 2019 (chosen due to severity); he has never had thyroid imaging).  Since on the tapazole, pt states he feels better in general.  Specifically, fatigue is resolved.  He wants to take RAI when he is on summer vacation.   Past Medical History:  Diagnosis Date  . Hypertension   . Thyroid disease   . Vertigo     No past surgical history on file.  Social History   Socioeconomic History  . Marital status: Married    Spouse name: Frank Vargas  . Number of children: 3  . Years of education: Not on file  . Highest education level: Not on file  Occupational History    Employer: [employer]  . Occupation: Pre-K Programmer, systems    Comment: [place]  Social Needs  . Financial resource strain: Not on file  . Food insecurity:    Worry: Not on file    Inability: Not on file  . Transportation needs:    Medical: Not on file    Non-medical: Not on file  Tobacco Use  . Smoking status: Never Smoker  . Smokeless tobacco: Never Used  Substance and Sexual Activity  . Alcohol use: Yes    Alcohol/week: 2.0 - 4.0 standard drinks    Types: 2 - 4 Cans of beer per week  . Drug use: Not Currently  . Sexual activity: Yes  Lifestyle  . Physical activity:    Days per week: Not on file    Minutes per session: Not on file  . Stress: Not on file  Relationships  . Social connections:    Talks on phone: Not on file    Gets together: Not on file    Attends religious service: Not on file    Active member of club or organization: Not on file    Attends meetings of clubs or organizations: Not on file    Relationship status: Not on file  . Intimate partner violence:    Fear of current or ex partner: Not on file    Emotionally abused: Not on file    Physically abused: Not on file    Forced sexual activity: Not on file  Other  Topics Concern  . Not on file  Social History Narrative  . Not on file    Current Outpatient Medications on File Prior to Visit  Medication Sig Dispense Refill  . fluticasone (FLONASE) 50 MCG/ACT nasal spray PLACE 2 SPRAYS INTO BOTH NOSTRILS DAILY. 16 g 3  . ibuprofen (ADVIL,MOTRIN) 800 MG tablet Take 1 tablet (800 mg total) by mouth every 8 (eight) hours as needed. 60 tablet 4  . lisinopril-hydrochlorothiazide (PRINZIDE,ZESTORETIC) 20-12.5 MG tablet TAKE 1 TABLET BY MOUTH DAILY. 90 tablet 3  . meclizine (ANTIVERT) 25 MG tablet Take 1 tablet (25 mg total) by mouth 3 (three) times daily. 30 tablet 3  . polycarbophil (FIBERCON) 625 MG tablet Take 1 tablet (625 mg total) by mouth daily. (Patient not taking: Reported on 06/21/2018) 90 tablet 3  . sennosides-docusate sodium (SENOKOT-S) 8.6-50 MG tablet Take 1 tablet by mouth daily. 90 tablet 3   No current facility-administered medications on file prior to visit.     No Known Allergies  Family History  Problem Relation Age of Onset  . Diabetes Mother   .  Heart disease Mother   . Hypertension Mother   . Hyperlipidemia Father   . Cancer Father [id number]       prostate  . Hypertension Maternal Uncle   . Diabetes Maternal Uncle   . Cancer Paternal Uncle 1970       Prostate  . Thyroid disease Neg Hx   . Colon cancer Neg Hx   . Esophageal cancer Neg Hx   . Stomach cancer Neg Hx   . Rectal cancer Neg Hx     BP (!) 146/96 (BP Location: Left Arm, Patient Position: Sitting, Cuff Size: Normal)   Pulse 68   Ht 5\' 7"  (1.702 m)   Wt 212 lb 6.4 oz (96.3 kg)   SpO2 97%   BMI 33.27 kg/m   Review of Systems Denies fever.      Objective:   Physical Exam VITAL SIGNS:  See vs page GENERAL: no distress NECK: There is no palpable thyroid enlargement.  No thyroid nodule is palpable.  No palpable lymphadenopathy at the anterior neck.   Lab Results  Component Value Date   TSH 0.01 (L) 06/21/2018       Assessment & Plan:  HTN: is noted today.    Hyperthyroidism: improved.  Reduce tapazole to 10-BID.   Patient Instructions  Your blood pressure is high today.  Please see your primary care provider soon, to have it rechecked.  blood tests are requested for you today.  We'll let you know about the results. If ever you have fever while taking methimazole, stop it and call us, even if the reason is obvious, because of the risk of a rare side-effect.  Please come back for a follow-up appointment in 2 months.

## 2018-08-23 ENCOUNTER — Ambulatory Visit: Payer: BLUE CROSS/BLUE SHIELD | Admitting: Endocrinology

## 2018-08-23 ENCOUNTER — Encounter: Payer: Self-pay | Admitting: Endocrinology

## 2018-08-23 VITALS — BP 118/62 | HR 59 | Ht 67.0 in | Wt 218.2 lb

## 2018-08-23 DIAGNOSIS — E0590 Thyrotoxicosis, unspecified without thyrotoxic crisis or storm: Secondary | ICD-10-CM | POA: Diagnosis not present

## 2018-08-23 LAB — T4, FREE: Free T4: 0.58 ng/dL — ABNORMAL LOW (ref 0.60–1.60)

## 2018-08-23 LAB — TSH: TSH: 1.56 u[IU]/mL (ref 0.35–4.50)

## 2018-08-23 NOTE — Patient Instructions (Addendum)
blood tests are requested for you today.  We'll let you know about the results.   Based on the results, we'll find out how much methimazole you should take.   If ever you have fever while taking methimazole, stop it and call us, even if the reason is obvious, because of the risk of a rare side-effect.   Please come back for a follow-up appointment in 2 months.

## 2018-08-23 NOTE — Progress Notes (Signed)
Subjective:    Patient ID: Frank Vargas, male    DOB: 10/11/1970, 48 y.o.   MRN: [id number]  HPI Pt returns for f/u of hyperthyroidism; (dx'ed 2018; he has been on tapazole since 2019 (chosen due to severity); he has never had thyroid imaging).  2 weeks ago, he re-increased the tapazole to 20-BID, due to palpitations in the chest, and assoc fatigue.  He wants to take RAI when he is on summer vacation.   Past Medical History:  Diagnosis Date  . Hypertension   . Thyroid disease   . Vertigo     No past surgical history on file.  Social History   Socioeconomic History  . Marital status: Married    Spouse name: Christepher Fronheiser  . Number of children: 3  . Years of education: Not on file  . Highest education level: Not on file  Occupational History    Employer: [employer]  . Occupation: Pre-K Programmer, systems    Comment: [place]  Social Needs  . Financial resource strain: Not on file  . Food insecurity:    Worry: Not on file    Inability: Not on file  . Transportation needs:    Medical: Not on file    Non-medical: Not on file  Tobacco Use  . Smoking status: Never Smoker  . Smokeless tobacco: Never Used  Substance and Sexual Activity  . Alcohol use: Yes    Alcohol/week: 2.0 - 4.0 standard drinks    Types: 2 - 4 Cans of beer per week  . Drug use: Not Currently  . Sexual activity: Yes  Lifestyle  . Physical activity:    Days per week: Not on file    Minutes per session: Not on file  . Stress: Not on file  Relationships  . Social connections:    Talks on phone: Not on file    Gets together: Not on file    Attends religious service: Not on file    Active member of club or organization: Not on file    Attends meetings of clubs or organizations: Not on file    Relationship status: Not on file  . Intimate partner violence:    Fear of current or ex partner: Not on file    Emotionally abused: Not on file    Physically abused: Not on file    Forced sexual activity: Not on file   Other Topics Concern  . Not on file  Social History Narrative  . Not on file    Current Outpatient Medications on File Prior to Visit  Medication Sig Dispense Refill  . fluticasone (FLONASE) 50 MCG/ACT nasal spray PLACE 2 SPRAYS INTO BOTH NOSTRILS DAILY. 16 g 3  . ibuprofen (ADVIL,MOTRIN) 800 MG tablet Take 1 tablet (800 mg total) by mouth every 8 (eight) hours as needed. 60 tablet 4  . lisinopril-hydrochlorothiazide (PRINZIDE,ZESTORETIC) 20-12.5 MG tablet TAKE 1 TABLET BY MOUTH DAILY. 90 tablet 3  . meclizine (ANTIVERT) 25 MG tablet Take 1 tablet (25 mg total) by mouth 3 (three) times daily. 30 tablet 3  . methimazole (TAPAZOLE) 10 MG tablet Take 1 tablet (10 mg total) by mouth 2 (two) times daily. 120 tablet 3   No current facility-administered medications on file prior to visit.     No Known Allergies  Family History  Problem Relation Age of Onset  . Diabetes Mother   . Heart disease Mother   . Hypertension Mother   . Hyperlipidemia Father   . Cancer Father 70  prostate  . Hypertension Maternal Uncle   . Diabetes Maternal Uncle   . Cancer Paternal Uncle 60       Prostate  . Thyroid disease Neg Hx   . Colon cancer Neg Hx   . Esophageal cancer Neg Hx   . Stomach cancer Neg Hx   . Rectal cancer Neg Hx     BP 118/62 (BP Location: Right Arm, Patient Position: Sitting, Cuff Size: Large)   Pulse (!) 59   Ht 5\' 7"  (1.702 m)   Wt 218 lb 3.2 oz (99 kg)   SpO2 92%   BMI 34.17 kg/m    Review of Systems Denies fever and tremor.      Objective:   Physical Exam VITAL SIGNS:  See vs page GENERAL: no distress NECK: There is no palpable thyroid enlargement.  No thyroid nodule is palpable.  No palpable lymphadenopathy at the anterior neck.    Lab Results  Component Value Date   TSH 1.56 08/23/2018      Assessment & Plan:  Hyperthyroidism: overcontrolled: reduce the methimazole back to 10 mg, twice a day Noncompliance with med dosing, new.    Patient  Instructions  blood tests are requested for you today.  We'll let you know about the results.   Based on the results, we'll find out how much methimazole you should take.   If ever you have fever while taking methimazole, stop it and call us, even if the reason is obvious, because of the risk of a rare side-effect.   Please come back for a follow-up appointment in 2 months.

## 2018-10-18 ENCOUNTER — Other Ambulatory Visit: Payer: Self-pay

## 2018-10-18 ENCOUNTER — Ambulatory Visit (INDEPENDENT_AMBULATORY_CARE_PROVIDER_SITE_OTHER): Payer: BLUE CROSS/BLUE SHIELD | Admitting: Endocrinology

## 2018-10-18 DIAGNOSIS — E0590 Thyrotoxicosis, unspecified without thyrotoxic crisis or storm: Secondary | ICD-10-CM | POA: Diagnosis not present

## 2018-10-18 NOTE — Progress Notes (Addendum)
Subjective:    Patient ID: Frank Vargas, male    DOB: 1971-03-25, 48 y.o.   MRN: [id number]  HPI  telehealth visit today via doxy video visit.  Alternatives to telehealth are presented to this patient, and the patient agrees to the telehealth visit. Pt is advised of the cost of the visit, and agrees to this, also.   Patient is at home, and I am at the office.   Pt returns for f/u of hyperthyroidism; (dx'ed 2018; he has been on tapazole since 2019 (chosen due to severity); he has never had thyroid imaging; but PE and severity suggest Grave's Dz).  He takes tapazole 10-BID, as rx'ed.  pt reports slight intermit palpitations in the chest, and assoc fatigue.  He wants to take RAI when he is on summer vacation.  Past Medical History:  Diagnosis Date   Hypertension    Thyroid disease    Vertigo     No past surgical history on file.  Social History   Socioeconomic History   Marital status: Married    Spouse name: Shyquan Shumard   Number of children: 3   Years of education: Not on file   Highest education level: Not on file  Occupational History    Employer: [employer]   Occupation: Pre-K Programmer, systems    Comment: [place]  Ecologist strain: Not on file   Food insecurity:    Worry: Not on file    Inability: Not on file   Transportation needs:    Medical: Not on file    Non-medical: Not on file  Tobacco Use   Smoking status: Never Smoker   Smokeless tobacco: Never Used  Substance and Sexual Activity   Alcohol use: Yes    Alcohol/week: 2.0 - 4.0 standard drinks    Types: 2 - 4 Cans of beer per week   Drug use: Not Currently   Sexual activity: Yes  Lifestyle   Physical activity:    Days per week: Not on file    Minutes per session: Not on file   Stress: Not on file  Relationships   Social connections:    Talks on phone: Not on file    Gets together: Not on file    Attends religious service: Not on file    Active member of club  or organization: Not on file    Attends meetings of clubs or organizations: Not on file    Relationship status: Not on file   Intimate partner violence:    Fear of current or ex partner: Not on file    Emotionally abused: Not on file    Physically abused: Not on file    Forced sexual activity: Not on file  Other Topics Concern   Not on file  Social History Narrative   Not on file    Current Outpatient Medications on File Prior to Visit  Medication Sig Dispense Refill   fluticasone (FLONASE) 50 MCG/ACT nasal spray PLACE 2 SPRAYS INTO BOTH NOSTRILS DAILY. 16 g 3   ibuprofen (ADVIL,MOTRIN) 800 MG tablet Take 1 tablet (800 mg total) by mouth every 8 (eight) hours as needed. 60 tablet 4   lisinopril-hydrochlorothiazide (PRINZIDE,ZESTORETIC) 20-12.5 MG tablet TAKE 1 TABLET BY MOUTH DAILY. 90 tablet 3   meclizine (ANTIVERT) 25 MG tablet Take 1 tablet (25 mg total) by mouth 3 (three) times daily. 30 tablet 3   methimazole (TAPAZOLE) 10 MG tablet Take 1 tablet (10 mg total) by mouth 2 (  two) times daily. 120 tablet 3   No current facility-administered medications on file prior to visit.     No Known Allergies  Family History  Problem Relation Age of Onset   Diabetes Mother    Heart disease Mother    Hypertension Mother    Hyperlipidemia Father    Cancer Father 49       prostate   Hypertension Maternal Uncle    Diabetes Maternal Uncle    Cancer Paternal Uncle 15       Prostate   Thyroid disease Neg Hx    Colon cancer Neg Hx    Esophageal cancer Neg Hx    Stomach cancer Neg Hx    Rectal cancer Neg Hx      Review of Systems Denies fever    Objective:   Physical Exam  Lab Results  Component Value Date   TSH 2.85 10/20/2018      Assessment & Plan:  Hyperthyroidism: well-controlled.  Please continue the same medication Palpitations: not thyroid-related. Please come back for a follow-up appointment in 2 months. Plan will be to schedule RAI then.

## 2018-10-18 NOTE — Patient Instructions (Addendum)
Blood tests are requested for you today.  We'll let you know about the results.   If ever you have fever while taking methimazole, stop it and call us, even if the reason is obvious, because of the risk of a rare side-effect.   Please come back for a follow-up appointment in 6-8 weeks.   

## 2018-10-20 ENCOUNTER — Other Ambulatory Visit: Payer: Self-pay

## 2018-10-20 ENCOUNTER — Other Ambulatory Visit (INDEPENDENT_AMBULATORY_CARE_PROVIDER_SITE_OTHER): Payer: BLUE CROSS/BLUE SHIELD

## 2018-10-20 DIAGNOSIS — E0590 Thyrotoxicosis, unspecified without thyrotoxic crisis or storm: Secondary | ICD-10-CM

## 2018-10-20 LAB — T4, FREE: Free T4: 0.7 ng/dL (ref 0.60–1.60)

## 2018-10-20 LAB — TSH: TSH: 2.85 u[IU]/mL (ref 0.35–4.50)

## 2018-10-28 ENCOUNTER — Other Ambulatory Visit: Payer: Self-pay | Admitting: Family Medicine

## 2018-11-03 ENCOUNTER — Other Ambulatory Visit: Payer: Self-pay | Admitting: *Deleted

## 2018-11-03 MED ORDER — MECLIZINE HCL 25 MG PO TABS: 25.0000 mg | ORAL_TABLET | Freq: Three times a day (TID) | ORAL | 3 refills | Status: DC

## 2018-11-11 ENCOUNTER — Other Ambulatory Visit: Payer: Self-pay | Admitting: Endocrinology

## 2019-03-13 ENCOUNTER — Other Ambulatory Visit: Payer: Self-pay

## 2019-03-15 ENCOUNTER — Ambulatory Visit (INDEPENDENT_AMBULATORY_CARE_PROVIDER_SITE_OTHER): Payer: BC Managed Care – PPO | Admitting: Endocrinology

## 2019-03-15 ENCOUNTER — Encounter: Payer: Self-pay | Admitting: Endocrinology

## 2019-03-15 ENCOUNTER — Other Ambulatory Visit: Payer: Self-pay

## 2019-03-15 VITALS — BP 120/80 | HR 56 | Ht 67.0 in | Wt 207.2 lb

## 2019-03-15 DIAGNOSIS — E0590 Thyrotoxicosis, unspecified without thyrotoxic crisis or storm: Secondary | ICD-10-CM | POA: Diagnosis not present

## 2019-03-15 NOTE — Progress Notes (Signed)
Subjective:    Patient ID: Frank Vargas, male    DOB: 10-30-1970, 48 y.o.   MRN: [id number]  HPI Pt returns for f/u of hyperthyroidism; (dx'ed 2018; he has been on tapazole since 2019 (chosen due to severity); he has never had thyroid imaging; but PE and severity suggest Grave's Dz).  He takes tapazole as rx'ed.  pt states he feels well in general, except for fatigue.   Past Medical History:  Diagnosis Date  . Hypertension   . Thyroid disease   . Vertigo     No past surgical history on file.  Social History   Socioeconomic History  . Marital status: Married    Spouse name: Dewitt Rotaina Moore-Agner  . Number of children: 3  . Years of education: Not on file  . Highest education level: Not on file  Occupational History    Employer: [employer]  . Occupation: Pre-K Programmer, systemseducator    Comment: [place]  Social Needs  . Financial resource strain: Not on file  . Food insecurity    Worry: Not on file    Inability: Not on file  . Transportation needs    Medical: Not on file    Non-medical: Not on file  Tobacco Use  . Smoking status: Never Smoker  . Smokeless tobacco: Never Used  Substance and Sexual Activity  . Alcohol use: Yes    Alcohol/week: 2.0 - 4.0 standard drinks    Types: 2 - 4 Cans of beer per week  . Drug use: Not Currently  . Sexual activity: Yes  Lifestyle  . Physical activity    Days per week: Not on file    Minutes per session: Not on file  . Stress: Not on file  Relationships  . Social Musicianconnections    Talks on phone: Not on file    Gets together: Not on file    Attends religious service: Not on file    Active member of club or organization: Not on file    Attends meetings of clubs or organizations: Not on file    Relationship status: Not on file  . Intimate partner violence    Fear of current or ex partner: Not on file    Emotionally abused: Not on file    Physically abused: Not on file    Forced sexual activity: Not on file  Other Topics Concern  . Not on file   Social History Narrative  . Not on file    Current Outpatient Medications on File Prior to Visit  Medication Sig Dispense Refill  . fluticasone (FLONASE) 50 MCG/ACT nasal spray PLACE 2 SPRAYS INTO BOTH NOSTRILS DAILY. (Patient taking differently: Place 1 spray into both nostrils daily as needed. ) 16 g 3  . ibuprofen (ADVIL,MOTRIN) 800 MG tablet TAKE 1 TABLET BY MOUTH EVERY 8 HOURS AS NEEDED 270 tablet 1  . lisinopril-hydrochlorothiazide (PRINZIDE,ZESTORETIC) 20-12.5 MG tablet TAKE 1 TABLET BY MOUTH DAILY. 90 tablet 3  . meclizine (ANTIVERT) 25 MG tablet Take 1 tablet (25 mg total) by mouth 3 (three) times daily. (Patient taking differently: Take 25 mg by mouth daily as needed. ) 30 tablet 3  . methimazole (TAPAZOLE) 10 MG tablet TAKE 2 TABLETS (20 MG TOTAL) BY MOUTH 2 (TWO) TIMES DAILY. (Patient taking differently: Take 10 mg by mouth 2 (two) times daily. ) 360 tablet 1   No current facility-administered medications on file prior to visit.     No Known Allergies  Family History  Problem Relation Age of  Onset  . Diabetes Mother   . Heart disease Mother   . Hypertension Mother   . Hyperlipidemia Father   . Cancer Father 75       prostate  . Hypertension Maternal Uncle   . Diabetes Maternal Uncle   . Cancer Paternal Uncle 50       Prostate  . Thyroid disease Neg Hx   . Colon cancer Neg Hx   . Esophageal cancer Neg Hx   . Stomach cancer Neg Hx   . Rectal cancer Neg Hx     BP 120/80 (BP Location: Left Arm, Patient Position: Sitting, Cuff Size: Large)   Pulse (!) 56   Ht 5\' 7"  (1.702 m)   Wt 207 lb 3.2 oz (94 kg)   SpO2 97%   BMI 32.45 kg/m    Review of Systems Denies fever    Objective:   Physical Exam VITAL SIGNS:  See vs page GENERAL: no distress NECK: thyroid is 3 times normal size, diffuse.     Lab Results  Component Value Date   TSH 11.76 (H) 03/15/2019      Assessment & Plan:  Hyperthyroidism, overcontrolled Fatigue: we discussed rx options.  Pt says  that if today's TFT are off (possibly explaining the fatigue), he probably will want to take RAI rx.  If not, he wants to continue tapazole.    Patient Instructions  Blood tests are requested for you today.  We'll let you know about the results.  If it is significantly abnormal, we'll plan to do the Radioactive iodine treatment pill.    If ever you have fever while taking methimazole, stop it and call us, even if the reason is obvious, because of the risk of a rare side-effect.   Please come back for a follow-up appointment in 3 months.

## 2019-03-15 NOTE — Patient Instructions (Signed)
Blood tests are requested for you today.  We'll let you know about the results.  If it is significantly abnormal, we'll plan to do the Radioactive iodine treatment pill.    If ever you have fever while taking methimazole, stop it and call us, even if the reason is obvious, because of the risk of a rare side-effect.   Please come back for a follow-up appointment in 3 months.

## 2019-03-16 LAB — TSH: TSH: 11.76 u[IU]/mL — ABNORMAL HIGH (ref 0.35–4.50)

## 2019-03-16 LAB — T4, FREE: Free T4: 0.51 ng/dL — ABNORMAL LOW (ref 0.60–1.60)

## 2019-04-12 ENCOUNTER — Other Ambulatory Visit: Payer: Self-pay | Admitting: *Deleted

## 2019-04-12 MED ORDER — LISINOPRIL-HYDROCHLOROTHIAZIDE 20-12.5 MG PO TABS: 1.0000 | ORAL_TABLET | Freq: Every day | ORAL | 3 refills | Status: DC

## 2019-05-01 ENCOUNTER — Other Ambulatory Visit: Payer: Self-pay | Admitting: Endocrinology

## 2019-06-05 ENCOUNTER — Other Ambulatory Visit: Payer: Self-pay

## 2019-06-07 ENCOUNTER — Ambulatory Visit: Payer: BC Managed Care – PPO | Admitting: Endocrinology

## 2019-06-07 ENCOUNTER — Encounter: Payer: Self-pay | Admitting: Endocrinology

## 2019-06-07 VITALS — BP 110/70 | HR 58 | Ht 67.0 in | Wt 212.4 lb

## 2019-06-07 DIAGNOSIS — E0590 Thyrotoxicosis, unspecified without thyrotoxic crisis or storm: Secondary | ICD-10-CM

## 2019-06-07 MED ORDER — METHIMAZOLE 10 MG PO TABS: 10.0000 mg | ORAL_TABLET | Freq: Every day | ORAL | 3 refills | Status: DC

## 2019-06-07 NOTE — Patient Instructions (Addendum)
Blood tests are requested for you today.  We'll let you know about the results.   If ever you have fever while taking methimazole, stop it and call us, even if the reason is obvious, because of the risk of a rare side-effect.  It is best to never miss the medication.  However, if you do miss it, next best is to double up the next time.   Please come back for a follow-up appointment in 4 months.   

## 2019-06-07 NOTE — Progress Notes (Signed)
Subjective:    Patient ID: Frank Vargas, male    DOB: 1971-04-30, 48 y.o.   MRN: [id number]  HPI Pt returns for f/u of hyperthyroidism; (dx'ed 2018; he has been on tapazole since 2019 (chosen due to severity); he has never had thyroid imaging; but PE and severity suggest Grave's Dz; he declines RAI rx).  He takes tapazole as rx'ed.  pt states he feels well in general.   Past Medical History:  Diagnosis Date  . Hypertension   . Thyroid disease   . Vertigo     No past surgical history on file.  Social History   Socioeconomic History  . Marital status: Married    Spouse name: Goerge Mohr  . Number of children: 3  . Years of education: Not on file  . Highest education level: Not on file  Occupational History    Employer: [employer]  . Occupation: Pre-K Programmer, systems    Comment: [place]  Social Needs  . Financial resource strain: Not on file  . Food insecurity    Worry: Not on file    Inability: Not on file  . Transportation needs    Medical: Not on file    Non-medical: Not on file  Tobacco Use  . Smoking status: Never Smoker  . Smokeless tobacco: Never Used  Substance and Sexual Activity  . Alcohol use: Yes    Alcohol/week: 2.0 - 4.0 standard drinks    Types: 2 - 4 Cans of beer per week  . Drug use: Not Currently  . Sexual activity: Yes  Lifestyle  . Physical activity    Days per week: Not on file    Minutes per session: Not on file  . Stress: Not on file  Relationships  . Social Musician on phone: Not on file    Gets together: Not on file    Attends religious service: Not on file    Active member of club or organization: Not on file    Attends meetings of clubs or organizations: Not on file    Relationship status: Not on file  . Intimate partner violence    Fear of current or ex partner: Not on file    Emotionally abused: Not on file    Physically abused: Not on file    Forced sexual activity: Not on file  Other Topics Concern  . Not on file   Social History Narrative  . Not on file    Current Outpatient Medications on File Prior to Visit  Medication Sig Dispense Refill  . fluticasone (FLONASE) 50 MCG/ACT nasal spray PLACE 2 SPRAYS INTO BOTH NOSTRILS DAILY. (Patient taking differently: Place 1 spray into both nostrils daily as needed. ) 16 g 3  . ibuprofen (ADVIL,MOTRIN) 800 MG tablet TAKE 1 TABLET BY MOUTH EVERY 8 HOURS AS NEEDED 270 tablet 1  . lisinopril-hydrochlorothiazide (ZESTORETIC) 20-12.5 MG tablet Take 1 tablet by mouth daily. 90 tablet 3  . meclizine (ANTIVERT) 25 MG tablet Take 1 tablet (25 mg total) by mouth 3 (three) times daily. (Patient taking differently: Take 25 mg by mouth daily as needed. ) 30 tablet 3   No current facility-administered medications on file prior to visit.     No Known Allergies  Family History  Problem Relation Age of Onset  . Diabetes Mother   . Heart disease Mother   . Hypertension Mother   . Hyperlipidemia Father   . Cancer Father 69       prostate  .  Hypertension Maternal Uncle   . Diabetes Maternal Uncle   . Cancer Paternal Uncle 55       Prostate  . Thyroid disease Neg Hx   . Colon cancer Neg Hx   . Esophageal cancer Neg Hx   . Stomach cancer Neg Hx   . Rectal cancer Neg Hx     BP 110/70 (BP Location: Right Arm, Patient Position: Sitting, Cuff Size: Normal)   Pulse (!) 58   Ht 5\' 7"  (1.702 m)   Wt 212 lb 6.4 oz (96.3 kg)   SpO2 95%   BMI 33.27 kg/m    Review of Systems Denies fever    Objective:   Physical Exam VITAL SIGNS:  See vs page GENERAL: no distress NECK: thyroid is slightly and diffusely enlarged.  No thyroid nodule is palpable.  No palpable lymphadenopathy at the anterior neck.   Lab Results  Component Value Date   TSH 3.31 06/07/2019      Assessment & Plan:  Hyperthyroidism: well-controlled.  Goiter: stable.   Patient Instructions  Blood tests are requested for you today.  We'll let you know about the results.  If ever you have fever  while taking methimazole, stop it and call us, even if the reason is obvious, because of the risk of a rare side-effect. It is best to never miss the medication.  However, if you do miss it, next best is to double up the next time.   Please come back for a follow-up appointment in 4 months.

## 2019-06-08 LAB — T4, FREE: Free T4: 0.72 ng/dL (ref 0.60–1.60)

## 2019-06-08 LAB — TSH: TSH: 3.31 u[IU]/mL (ref 0.35–4.50)

## 2019-08-31 ENCOUNTER — Other Ambulatory Visit: Payer: Self-pay

## 2019-09-04 ENCOUNTER — Encounter: Payer: Self-pay | Admitting: Endocrinology

## 2019-09-04 ENCOUNTER — Other Ambulatory Visit: Payer: Self-pay

## 2019-09-04 ENCOUNTER — Ambulatory Visit (INDEPENDENT_AMBULATORY_CARE_PROVIDER_SITE_OTHER): Payer: BC Managed Care – PPO | Admitting: Endocrinology

## 2019-09-04 VITALS — BP 132/80 | HR 57 | Ht 67.0 in | Wt 217.8 lb

## 2019-09-04 DIAGNOSIS — R5383 Other fatigue: Secondary | ICD-10-CM | POA: Diagnosis not present

## 2019-09-04 DIAGNOSIS — E0590 Thyrotoxicosis, unspecified without thyrotoxic crisis or storm: Secondary | ICD-10-CM

## 2019-09-04 LAB — BASIC METABOLIC PANEL
BUN: 18 mg/dL (ref 6–23)
CO2: 32 mEq/L (ref 19–32)
Calcium: 9.2 mg/dL (ref 8.4–10.5)
Chloride: 100 mEq/L (ref 96–112)
Creatinine, Ser: 1.37 mg/dL (ref 0.40–1.50)
GFR: 67.46 mL/min (ref >60.00)
Glucose, Bld: 88 mg/dL (ref 70–99)
Potassium: 3.6 mEq/L (ref 3.5–5.1)
Sodium: 137 mEq/L (ref 135–145)

## 2019-09-04 LAB — CBC WITH DIFFERENTIAL/PLATELET
Basophils Absolute: 0.1 10*3/uL (ref 0.0–0.1)
Basophils Relative: 1.2 % (ref 0.0–3.0)
Eosinophils Absolute: 0.1 10*3/uL (ref 0.0–0.7)
Eosinophils Relative: 1.9 % (ref 0.0–5.0)
HCT: 44.5 % (ref 39.0–52.0)
Hemoglobin: 14.7 g/dL (ref 13.0–17.0)
Lymphocytes Relative: 43.3 % (ref 12.0–46.0)
Lymphs Abs: 2.1 10*3/uL (ref 0.7–4.0)
MCHC: 33.1 g/dL (ref 30.0–36.0)
MCV: 89.2 fl (ref 78.0–100.0)
Monocytes Absolute: 0.6 10*3/uL (ref 0.1–1.0)
Monocytes Relative: 12.2 % — ABNORMAL HIGH (ref 3.0–12.0)
Neutro Abs: 2 10*3/uL (ref 1.4–7.7)
Neutrophils Relative %: 41.4 % — ABNORMAL LOW (ref 43.0–77.0)
Platelets: 263 10*3/uL (ref 150.0–400.0)
RBC: 4.99 Mil/uL (ref 4.22–5.81)
RDW: 14 % (ref 11.5–15.5)
WBC: 4.9 10*3/uL (ref 4.0–10.5)

## 2019-09-04 LAB — TSH: TSH: 2.97 u[IU]/mL (ref 0.35–4.50)

## 2019-09-04 LAB — T4, FREE: Free T4: 0.64 ng/dL (ref 0.60–1.60)

## 2019-09-04 NOTE — Progress Notes (Signed)
Subjective:    Patient ID: Frank Vargas, male    DOB: 03-17-1971, 49 y.o.   MRN: [id number]  HPI Pt returns for f/u of hyperthyroidism; (dx'ed 2018; he has been on tapazole since 2019 (initially chosen due to severity); he has never had thyroid imaging; but PE and severity suggest Grave's Dz; he declines RAI rx).  He takes tapazole as rx'ed.  pt states he feels well in general, except for fatigue.  Past Medical History:  Diagnosis Date  . Hypertension   . Thyroid disease   . Vertigo     No past surgical history on file.  Social History   Socioeconomic History  . Marital status: Married    Spouse name: Barnes Florek  . Number of children: 3  . Years of education: Not on file  . Highest education level: Not on file  Occupational History    Employer: [employer]  . Occupation: Pre-K Tourist information centre manager    Comment: [place]  Tobacco Use  . Smoking status: Never Smoker  . Smokeless tobacco: Never Used  Substance and Sexual Activity  . Alcohol use: Yes    Alcohol/week: 2.0 - 4.0 standard drinks    Types: 2 - 4 Cans of beer per week  . Drug use: Not Currently  . Sexual activity: Yes  Other Topics Concern  . Not on file  Social History Narrative  . Not on file   Social Determinants of Health   Financial Resource Strain:   . Difficulty of Paying Living Expenses: Not on file  Food Insecurity:   . Worried About Charity fundraiser in the Last Year: Not on file  . Ran Out of Food in the Last Year: Not on file  Transportation Needs:   . Lack of Transportation (Medical): Not on file  . Lack of Transportation (Non-Medical): Not on file  Physical Activity:   . Days of Exercise per Week: Not on file  . Minutes of Exercise per Session: Not on file  Stress:   . Feeling of Stress : Not on file  Social Connections:   . Frequency of Communication with Friends and Family: Not on file  . Frequency of Social Gatherings with Friends and Family: Not on file  . Attends Religious Services:  Not on file  . Active Member of Clubs or Organizations: Not on file  . Attends Archivist Meetings: Not on file  . Marital Status: Not on file  Intimate Partner Violence:   . Fear of Current or Ex-Partner: Not on file  . Emotionally Abused: Not on file  . Physically Abused: Not on file  . Sexually Abused: Not on file    Current Outpatient Medications on File Prior to Visit  Medication Sig Dispense Refill  . fluticasone (FLONASE) 50 MCG/ACT nasal spray PLACE 2 SPRAYS INTO BOTH NOSTRILS DAILY. (Patient taking differently: Place 1 spray into both nostrils daily as needed. ) 16 g 3  . ibuprofen (ADVIL,MOTRIN) 800 MG tablet TAKE 1 TABLET BY MOUTH EVERY 8 HOURS AS NEEDED 270 tablet 1  . lisinopril-hydrochlorothiazide (ZESTORETIC) 20-12.5 MG tablet Take 1 tablet by mouth daily. 90 tablet 3  . meclizine (ANTIVERT) 25 MG tablet Take 1 tablet (25 mg total) by mouth 3 (three) times daily. (Patient taking differently: Take 25 mg by mouth daily as needed. ) 30 tablet 3   No current facility-administered medications on file prior to visit.    No Known Allergies  Family History  Problem Relation Age of Onset  .  Diabetes Mother   . Heart disease Mother   . Hypertension Mother   . Hyperlipidemia Father   . Cancer Father 103       prostate  . Hypertension Maternal Uncle   . Diabetes Maternal Uncle   . Cancer Paternal Uncle 22       Prostate  . Thyroid disease Neg Hx   . Colon cancer Neg Hx   . Esophageal cancer Neg Hx   . Stomach cancer Neg Hx   . Rectal cancer Neg Hx     BP 132/80 (BP Location: Left Arm, Patient Position: Sitting, Cuff Size: Large)   Pulse (!) 57   Ht 5\' 7"  (1.702 m)   Wt 217 lb 12.8 oz (98.8 kg)   SpO2 98%   BMI 34.11 kg/m    Review of Systems Denies fever and neck swelling    Objective:   Physical Exam VITAL SIGNS:  See vs page.   GENERAL: no distress.  NECK: thyroid is slightly enlarged.  No thyroid nodule is palpable.  No palpable lymphadenopathy  at the anterior neck.     Lab Results  Component Value Date   TSH 2.97 09/04/2019      Assessment & Plan:  Fatigue, new, uncertain etiology.  Check labs for this Hyperthyroidism: well-controlled  Patient Instructions  Blood tests are requested for you today.  We'll let you know about the results.  If ever you have fever while taking methimazole, stop it and call 09/06/2019, even if the reason is obvious, because of the risk of a rare side-effect.   It is best to never miss the medication.  However, if you do miss it, next best is to double up the next time.   Please come back for a follow-up appointment in 4 months.

## 2019-09-04 NOTE — Patient Instructions (Signed)
Blood tests are requested for you today.  We'll let you know about the results.   If ever you have fever while taking methimazole, stop it and call us, even if the reason is obvious, because of the risk of a rare side-effect.  It is best to never miss the medication.  However, if you do miss it, next best is to double up the next time.   Please come back for a follow-up appointment in 4 months.   

## 2019-09-05 ENCOUNTER — Other Ambulatory Visit: Payer: Self-pay | Admitting: Endocrinology

## 2019-09-05 DIAGNOSIS — E0590 Thyrotoxicosis, unspecified without thyrotoxic crisis or storm: Secondary | ICD-10-CM

## 2019-09-06 MED ORDER — METHIMAZOLE 10 MG PO TABS: 10.0000 mg | ORAL_TABLET | Freq: Every day | ORAL | 3 refills | Status: DC

## 2019-09-24 ENCOUNTER — Telehealth: Payer: Self-pay | Admitting: Family Medicine

## 2019-09-24 ENCOUNTER — Other Ambulatory Visit (HOSPITAL_COMMUNITY): Payer: BC Managed Care – PPO

## 2019-09-24 ENCOUNTER — Ambulatory Visit (HOSPITAL_COMMUNITY): Payer: BC Managed Care – PPO

## 2019-09-24 DIAGNOSIS — E0590 Thyrotoxicosis, unspecified without thyrotoxic crisis or storm: Secondary | ICD-10-CM

## 2019-09-24 NOTE — Telephone Encounter (Signed)
Received call from patient and wife--- would like to discuss thyroid care with [facility].  Referral sent to [facility].  Terisa Starr, MD  [facility] Medicine Teaching Service

## 2019-09-25 ENCOUNTER — Other Ambulatory Visit (HOSPITAL_COMMUNITY): Payer: BC Managed Care – PPO

## 2019-10-01 ENCOUNTER — Ambulatory Visit (HOSPITAL_COMMUNITY): Payer: BC Managed Care – PPO

## 2019-10-01 ENCOUNTER — Other Ambulatory Visit (HOSPITAL_COMMUNITY): Payer: BC Managed Care – PPO

## 2019-10-02 ENCOUNTER — Other Ambulatory Visit (HOSPITAL_COMMUNITY): Payer: BC Managed Care – PPO

## 2019-10-06 ENCOUNTER — Other Ambulatory Visit: Payer: Self-pay | Admitting: Endocrinology

## 2019-10-10 ENCOUNTER — Ambulatory Visit: Payer: BC Managed Care – PPO | Admitting: Endocrinology

## 2019-11-08 ENCOUNTER — Other Ambulatory Visit: Payer: Self-pay | Admitting: Family Medicine

## 2019-11-14 ENCOUNTER — Telehealth: Payer: Self-pay | Admitting: *Deleted

## 2019-11-14 NOTE — Telephone Encounter (Signed)
Wife Inetta Fermo called today to inquire about referral for endocrinology.  She states that her and patient have contacted the office at [facility] multiple times in the last 2-3 weeks and they were informed that the referral wasn't ever received.  I let Inetta Fermo know that from what I could see in the referral it was sent over but I would check with referral coordinator and pcp to see if they have heard anything about the delay.  Annabelle Rexroad,CMA

## 2019-11-15 NOTE — Telephone Encounter (Signed)
I have re-sent his referral to [facility]   [address] [place] [phone]

## 2019-11-21 NOTE — Telephone Encounter (Signed)
Received message from patient's husband re: difficulty with scheduling appointment at [facility]. Can we please look into this?  Thank you, Terisa Starr, MD  [facility] Medicine Teaching Service

## 2019-11-22 NOTE — Telephone Encounter (Signed)
Annice Pih will check on this today.  I have mychart messaged wife to update her. Jone Baseman, CMA

## 2019-11-28 ENCOUNTER — Telehealth: Payer: Self-pay | Admitting: Family Medicine

## 2019-11-28 NOTE — Telephone Encounter (Signed)
I called [facility] to check the status of his referral.   Patient has an upcoming appointment at [facility] Endocrinology on 01/25/20 at 4:30 PM.  Patient is aware of the date and time.   Annice Pih

## 2019-12-06 ENCOUNTER — Encounter: Payer: Self-pay | Admitting: Family Medicine

## 2019-12-14 ENCOUNTER — Ambulatory Visit: Payer: BC Managed Care – PPO | Admitting: Family Medicine

## 2019-12-14 ENCOUNTER — Other Ambulatory Visit: Payer: Self-pay

## 2019-12-14 ENCOUNTER — Encounter: Payer: Self-pay | Admitting: Family Medicine

## 2019-12-14 VITALS — BP 104/80 | HR 51 | Ht 67.0 in | Wt 215.4 lb

## 2019-12-14 DIAGNOSIS — H6192 Disorder of left external ear, unspecified: Secondary | ICD-10-CM

## 2019-12-14 DIAGNOSIS — Z0000 Encounter for general adult medical examination without abnormal findings: Secondary | ICD-10-CM | POA: Diagnosis not present

## 2019-12-14 DIAGNOSIS — I10 Essential (primary) hypertension: Secondary | ICD-10-CM

## 2019-12-14 DIAGNOSIS — Z13220 Encounter for screening for lipoid disorders: Secondary | ICD-10-CM | POA: Diagnosis not present

## 2019-12-14 DIAGNOSIS — K5909 Other constipation: Secondary | ICD-10-CM

## 2019-12-14 DIAGNOSIS — M25512 Pain in left shoulder: Secondary | ICD-10-CM

## 2019-12-14 DIAGNOSIS — K5901 Slow transit constipation: Secondary | ICD-10-CM

## 2019-12-14 DIAGNOSIS — H9392 Unspecified disorder of left ear: Secondary | ICD-10-CM

## 2019-12-14 DIAGNOSIS — G8929 Other chronic pain: Secondary | ICD-10-CM

## 2019-12-14 MED ORDER — DICLOFENAC SODIUM 1 % EX GEL: 4.0000 g | Freq: Four times a day (QID) | CUTANEOUS | 3 refills | Status: DC

## 2019-12-14 NOTE — Assessment & Plan Note (Signed)
Discussed refer to gastroenterology as above.

## 2019-12-14 NOTE — Progress Notes (Signed)
SUBJECTIVE:   CHIEF COMPLAINT / HPI:  Frank Vargas is a pleasant 49 year old gentleman with history significant for essential hypertension and hypothyroidism presenting today for routine follow-up.  This is his physical examination.  The patient reports compliance with his antihypertensive agent.  He denies side effects.  He denies chest pain or difficulty breathing.  He denies headaches or dizziness.  He has been trying to increase his activity and is interested in coming off his blood pressure medication as he feels he has increased his activity and improved his appetite over the last few years.  In terms of the patient's hyperthyroidism he is following with endocrinology and has a follow-up with [facility] endocrinology for discussion of possible radioiodine ablation therapy.   The patient has several concerns today.  He reports intermittent hand swelling and feet swelling.  This is primarily in his hands when he runs.  He notices rings getting tight.  He does use ibuprofen, usually before he runs.  He reports he is not sweating as much.  Denies dyspnea on exertion, orthopnea or PND.  No chest pain with running.  He runs 2-3 times a week right now.  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   The patient reports lifelong constipation.  He had a prior colonoscopy in 2019.  He reports he goes to the bathroom at most 2-3 times a week if he takes no medications.  Stools are hard.  If he takes miralax and metamucil regularly, this helps. He is worried about taking these lifelong and wonders if his bloating is a symptom of another condition. He denies blood in his stool.  He denies a family history of colorectal cancer.  If works schedule allows he tries to eat a consider amount of leafy green vegetables and fruits--this improves constipation.   He does drink 64 ounces of water a day.   Sometimes when he is particularly busy he will eat 1 meal which worsens symptoms.   He has associated bloating abdominal fullness at times with this.  Reports some discomfort and symptoms of early satiety.  He denies right upper quadrant pain,  No personal history of gallstones.  No nausea and vomiting with this.   The patient reports considerable fatigue at times during the day.  He sleeps on average 1.5 to 3 hours per night.  He works 2 jobs.  He is unsure if he snores (will as Mrs. Henrene Pastor).  Rest review of systems negative as above.  He had a recent blood count which showed no anemia.  Vertigo The patient has intermittent BPPV which significantly limits his ability to go to work at times.  This occurs infrequently but when it does occur it can last 1 to 2 days.  He is asymptomatic at this time.  PERTINENT  PMH / PSH/Family/Social History :  Updated in the history tab is appropriate.   History also updated.  His paternal grandfather was recently diagnosed with thyroid cancer.  He has a maternal history of thyroid disease as well.  OBJECTIVE:   BP 104/80   Pulse (!) 51   Ht 5\' 7"  (1.702 m)   Wt 215 lb 6.4 oz (97.7 kg)   SpO2 98%   BMI 33.74 kg/m   HEENT: Sclera anicteric. Dentition is moderate. Appears well hydrated. Left ear with non-healing lesion, 0.5 cm, erythematous with dry  eschar  Neck: Supple, LAD  Cardiac: Regular rate and rhythm. Normal S1/S2. No murmurs, rubs, or gallops appreciate Lungs: Clear bilaterally to ascultation.  Abdomen: Normoactive bowel sounds, some fullness, no RUQ pain. No tenderness to deep or light palpation. No rebound or guarding.  Extremities: Warm, well perfused without edema.  Skin: Warm, dry Psych: Pleasant and appropriate     ASSESSMENT/PLAN:   Arshia was seen today for leg swelling, hand swelling and fatigue.  Diagnoses and all orders for this visit:  Annual physical exam -     Lipid Panel -     Basic Metabolic Panel - We discussed  healthy eating habits, moderate physical activity for 30 minutes 5 times per week (or 150 minutes), safe sex practices, avoiding tobacco products, safe alcohol consumption, and safe driving habits.   HYPERTENSION, at goal  -     Basic Metabolic Panel  Screening cholesterol level -     Lipid Panel  Lesion of left ear, possibly just due to trauma, although less likely given nonhealing nature, referral to derm as below.  -     Ambulatory referral to Dermatology  Chronic constipation most consistent with IBS C although also symptomatic cholelithiasis is considered.  Given the duration of symptoms.  Will refer to gastroenterology for recommendations regarding medications or other evaluation.  Patient reports this really began when he drank water in [place] and query of H. pylori could be underlying cause of bloating  -     Ambulatory referral to Gastroenterology  Chronic left shoulder pain -     diclofenac Sodium (VOLTAREN) 1 % GEL; Apply 4 g topically 4 (four) times daily.  Bilateral hand edema, likely in part normal due to redistribution of blood flow during running and with heat.  Discussed this.  NSAIDs may be contributing as well.  Instead of using ibuprofen we will transition to topical NSAIDs.    Terisa Starr, MD  Family Medicine Teaching Service  [facility]

## 2019-12-14 NOTE — Assessment & Plan Note (Signed)
Blood pressure well below goal today.  Given his lifestyle changes we discussed options.  He will stop his blood pressure medication in July when he is out of school as he increase his activity.  He is to keep a blood pressure log.  Will send my chart reminder to the patient.

## 2019-12-14 NOTE — Patient Instructions (Addendum)
Fax [phone]   Put a pause on your blood pressure medication for July---send me your blood pressure levels    Please stop ibuprofen---you can use the gel on you shoulder  You should be called by Dermatology and Gastroenterology  Please let me know if you do NOT hear from them  It was wonderful to see you today.  Please bring ALL of your medications with you to every visit.   Thank you for choosing [facility].   Please call [phone] with any questions about today's appointment.  Please be sure to schedule follow up at the front  desk before you leave today.   Terisa Starr, MD  Family Medicine

## 2019-12-15 LAB — BASIC METABOLIC PANEL
BUN/Creatinine Ratio: 13 (ref 9–20)
BUN: 20 mg/dL (ref 6–24)
CO2: 24 mmol/L (ref 20–29)
Calcium: 9.8 mg/dL (ref 8.7–10.2)
Chloride: 103 mmol/L (ref 96–106)
Creatinine, Ser: 1.49 mg/dL — ABNORMAL HIGH (ref 0.76–1.27)
GFR calc Af Amer: 64 mL/min/{1.73_m2} (ref >59)
GFR calc non Af Amer: 55 mL/min/{1.73_m2} — ABNORMAL LOW (ref >59)
Glucose: 85 mg/dL (ref 65–99)
Potassium: 4.4 mmol/L (ref 3.5–5.2)
Sodium: 141 mmol/L (ref 134–144)

## 2019-12-15 LAB — LIPID PANEL
Chol/HDL Ratio: 4.3 ratio (ref 0.0–5.0)
Cholesterol, Total: 216 mg/dL — ABNORMAL HIGH (ref 100–199)
HDL: 50 mg/dL (ref >39)
LDL Chol Calc (NIH): 147 mg/dL — ABNORMAL HIGH (ref 0–99)
Triglycerides: 107 mg/dL (ref 0–149)
VLDL Cholesterol Cal: 19 mg/dL (ref 5–40)

## 2019-12-18 ENCOUNTER — Telehealth: Payer: Self-pay | Admitting: Family Medicine

## 2019-12-18 ENCOUNTER — Telehealth: Payer: Self-pay

## 2019-12-18 NOTE — Telephone Encounter (Signed)
Received fax from pharmacy, PA needed on Diclofenac Gel. Clinical questions submitted via Cover My Meds.  Waiting on response, could take up to 72 hours.   Cover My Meds info: Key: [id number]

## 2019-12-18 NOTE — Telephone Encounter (Signed)
FMLA paperwork received, completed, placed in to fax pile.  Terisa Starr, MD  Family Medicine Teaching Service

## 2019-12-25 NOTE — Telephone Encounter (Signed)
Medication was denied. The medication is OTC now.

## 2020-01-07 ENCOUNTER — Ambulatory Visit: Payer: BC Managed Care – PPO | Admitting: Endocrinology

## 2020-03-10 ENCOUNTER — Encounter: Payer: Self-pay | Admitting: Family Medicine

## 2020-03-19 ENCOUNTER — Encounter: Payer: Self-pay | Admitting: Gastroenterology

## 2020-03-19 ENCOUNTER — Ambulatory Visit (INDEPENDENT_AMBULATORY_CARE_PROVIDER_SITE_OTHER): Payer: BC Managed Care – PPO | Admitting: Gastroenterology

## 2020-03-19 VITALS — BP 126/84 | HR 74 | Ht 67.0 in | Wt 213.0 lb

## 2020-03-19 DIAGNOSIS — K5900 Constipation, unspecified: Secondary | ICD-10-CM | POA: Diagnosis not present

## 2020-03-19 NOTE — Patient Instructions (Signed)
If you are age 47 or older, your body mass index should be between 23-30. Your Body mass index is 33.36 kg/m. If this is out of the aforementioned range listed, please consider follow up with your Primary Care Provider.  If you are age 75 or younger, your body mass index should be between 19-25. Your Body mass index is 33.36 kg/m. If this is out of the aformentioned range listed, please consider follow up with your Primary Care Provider.   Please resume Miralax and Citrucel daily.  Please call us in 5 - 6 weeks to let us know how you are feeling.  Thank you for entrusting me with your care and choosing [facility].  Dr Christella Hartigan

## 2020-03-19 NOTE — Progress Notes (Signed)
Review of pertinent gastrointestinal problems: 1.  Chronic constipation.  Colonoscopy November 2019 Dr. Christella Hartigan, completely normal.  He was recommended to continue once daily Citrucel and once daily MiraLAX. 2.  Routine risk for colon cancer.  Colonoscopy November 2019 above.  Repeat colon cancer screening with colonoscopy November 2029  HPI: Frank is a very pleasant 49 year old Vargas whom I last saw almost 2 years ago the time of a colonoscopy November 2019.  See those results summarized above  Meclizine can rarely cause constipation  Blood work February 2021 TSH was normal  Blood work May 2021 creatinine 1.5  He tells me that while he was taking Citrucel once daily and MiraLAX once daily his bowels were under very good control.  He felt very well, he actually felt great.  He has intermittently done colon cleanses at times and also that helps him feel great.  When he stops taking the Citrucel and MiraLAX combination he struggles to move his bowels even every 3 to 4 days with only a lot of pushing and straining.  He sees no blood in his stool.  His weight is up 10 to 15 pounds in the past year.  He has not taken MiraLAX or Citrucel in many months.   ROS: complete GI ROS as described in HPI, all other review negative.  Constitutional:  No unintentional weight loss   Past Medical History:  Diagnosis Date  . Hypertension   . Hyperthyroidism   . Vertigo     History reviewed. No pertinent surgical history.  Current Outpatient Medications  Medication Sig Dispense Refill  . diclofenac Sodium (VOLTAREN) 1 % GEL Apply 4 g topically 4 (four) times daily. 100 g 3  . fluticasone (FLONASE) 50 MCG/ACT nasal spray PLACE 2 SPRAYS INTO BOTH NOSTRILS DAILY. (Patient taking differently: Place 1 spray into both nostrils daily as needed. ) 16 g 3  . ibuprofen (ADVIL,MOTRIN) 800 MG tablet TAKE 1 TABLET BY MOUTH EVERY 8 HOURS AS NEEDED 270 tablet 1  . lisinopril-hydrochlorothiazide (ZESTORETIC) 20-12.5 MG  tablet Take 1 tablet by mouth daily. 90 tablet 3  . meclizine (ANTIVERT) 25 MG tablet Take 1 tablet (25 mg total) by mouth daily as needed. 30 tablet 3  . methimazole (TAPAZOLE) 10 MG tablet TAKE 2 TABLETS BY MOUTH TWICE A DAY 360 tablet 1   No current facility-administered medications for Frank visit.    Allergies as of 03/19/2020  . (No Known Allergies)    Family History  Problem Relation Age of Onset  . Diabetes Mother   . Heart disease Mother   . Hypertension Mother   . Thyroid disease Mother   . Hyperlipidemia Father   . Cancer Father 64       prostate  . Hypertension Maternal Uncle   . Diabetes Maternal Uncle   . Cancer Paternal Uncle 40       Prostate  . Thyroid cancer Paternal Grandfather   . Colon cancer Neg Hx   . Esophageal cancer Neg Hx   . Stomach cancer Neg Hx   . Rectal cancer Neg Hx     Social History   Socioeconomic History  . Marital status: Married    Spouse name: Rashid Whitenight  . Number of children: 3  . Years of education: Not on file  . Highest education level: Not on file  Occupational History    Employer: [employer]  . Occupation: Pre-K Programmer, systems    Comment: [place]  Tobacco Use  . Smoking status: Never Smoker  .  Smokeless tobacco: Never Used  Vaping Use  . Vaping Use: Never used  Substance and Sexual Activity  . Alcohol use: Yes    Alcohol/week: 2.0 - 4.0 standard drinks    Types: 2 - 4 Cans of beer per week  . Drug use: Not Currently  . Sexual activity: Yes  Other Topics Concern  . Not on file  Social History Narrative   Works as Environmental consultant and at [employer]    Has 4 kids   Wife is Jakyren Fluegge back at [employer] and [employer]    Social Determinants of Health   Financial Resource Strain:   . Difficulty of Paying Living Expenses: Not on file  Food Insecurity:   . Worried About Programme researcher, broadcasting/film/video in the Last Year: Not on file  . Ran Out of Food in the Last Year: Not on file  Transportation Needs:   . Lack of Transportation  (Medical): Not on file  . Lack of Transportation (Non-Medical): Not on file  Physical Activity:   . Days of Exercise per Week: Not on file  . Minutes of Exercise per Session: Not on file  Stress:   . Feeling of Stress : Not on file  Social Connections:   . Frequency of Communication with Friends and Family: Not on file  . Frequency of Social Gatherings with Friends and Family: Not on file  . Attends Religious Services: Not on file  . Active Member of Clubs or Organizations: Not on file  . Attends Banker Meetings: Not on file  . Marital Status: Not on file  Intimate Partner Violence:   . Fear of Current or Ex-Partner: Not on file  . Emotionally Abused: Not on file  . Physically Abused: Not on file  . Sexually Abused: Not on file     Physical Exam: BP 126/84   Pulse 74   Ht 5\' 7"  (1.702 m)   Wt 213 lb (96.6 kg)   BMI 33.36 kg/m  Constitutional: generally well-appearing Psychiatric: alert and oriented x3 Abdomen: soft, nontender, nondistended, no obvious ascites, no peritoneal signs, normal bowel sounds No peripheral edema noted in lower extremities  Assessment and plan: 49 y.o. male with chronic constipation  He had a full colonoscopy about 2 years ago.  He has no alarm symptoms.  I do not think he needs any further testing for his chronic constipation.  Instead I recommended that he get back on the MiraLAX and fiber supplement combination which has worked for him them in the past.  I asked that he call here in 5 or 6 weeks to report on his response.  Please see the "Patient Instructions" section for addition details about the plan.  59, MD [facility] 03/19/2020, 9:48 AM   Total time on date of encounter was 30 minutes (Frank included time spent preparing to see the patient reviewing records; obtaining and/or reviewing separately obtained history; performing a medically appropriate exam and/or evaluation; counseling and educating the  patient and family if present; ordering medications, tests or procedures if applicable; and documenting clinical information in the health record).

## 2020-03-27 ENCOUNTER — Ambulatory Visit: Payer: BC Managed Care – PPO | Admitting: Physician Assistant

## 2020-04-10 ENCOUNTER — Other Ambulatory Visit: Payer: Self-pay | Admitting: Family Medicine

## 2020-04-10 ENCOUNTER — Other Ambulatory Visit: Payer: Self-pay | Admitting: Endocrinology

## 2020-05-01 ENCOUNTER — Encounter: Payer: Self-pay | Admitting: Sports Medicine

## 2020-05-01 ENCOUNTER — Other Ambulatory Visit: Payer: Self-pay

## 2020-05-01 ENCOUNTER — Ambulatory Visit: Payer: BC Managed Care – PPO | Admitting: Sports Medicine

## 2020-05-01 VITALS — BP 118/82 | Ht 67.0 in | Wt 215.0 lb

## 2020-05-01 DIAGNOSIS — S92534A Nondisplaced fracture of distal phalanx of right lesser toe(s), initial encounter for closed fracture: Secondary | ICD-10-CM | POA: Diagnosis not present

## 2020-05-01 NOTE — Progress Notes (Signed)
   PCP: Westley Chandler, MD  Subjective:   HPI: Injury working at [employer] Date: Friday Oct 8 Supervisor notified [employer] ; dropped box on distal 5th  toe and lateral foot  Patient is a 49 y.o. male here for evaluation of right fifth toe injury.  5 days ago, he was carrying a box and dropped it on his fifth toe.  He had immediate pain and then subsequent swelling and bruising over his toe.  He has been able to walk but does have some pain when he wears closed toed shoes or if he has pressure on his toe.  The pain is mostly at the distal aspect of his fifth toe.  He is not any numbness or tingling, has not had any weakness that he is aware of.   Review of Systems:  Per HPI.   PMFSH, medications and smoking status reviewed.      Objective:  Physical Exam:  No flowsheet data found.  BP 118/82   Ht 5\' 7"  (1.702 m)   Wt 215 lb (97.5 kg)   BMI 33.67 kg/m   Gen: awake, alert, NAD, comfortable in exam room Pulm: breathing unlabored  Right foot: -Inspection: Mild edema and ecchymoses over the fifth toe distally.  No malrotation.  Otherwise no abnormalities on inspection. -Palpation: Mildly tender to palpation especially over the distal phalanx of the fifth toe, nontender in forefoot, midfoot or other toes. -ROM: Intact range of motion at the fifth toe with flexion and extension compared to contralateral toe. -Strength: Intact to dorsiflexion and plantarflexion of the fifth toe.  Limited right fifth toe: Ultrasound examination of the fifth toe shows a small nondisplaced fracture of the distal phalanx which is visible in both the longitudinal and transverse axis with a very small amount of edema overlying the fracture site.   Assessment & Plan:  1.  Right fifth toe distal phalanx fracture, nondisplaced  Patient with fracture seen on ultrasound.  Is nondisplaced and is not having any red flag symptoms.  We'll plan to manage conservatively with buddy taping for the next 3 to 4 weeks.  Return  to clinic if any questions or concerns.   [place], MD [facility] 05/01/2020 5:02 PM   I observed and examined the patient with the [facility] resident and agree with assessment and plan.  Note reviewed and modified by me. [facility], MD

## 2020-05-05 ENCOUNTER — Encounter: Payer: Self-pay | Admitting: Family Medicine

## 2020-06-04 ENCOUNTER — Encounter: Payer: Self-pay | Admitting: Family Medicine

## 2020-06-04 ENCOUNTER — Other Ambulatory Visit: Payer: Self-pay | Admitting: Family Medicine

## 2020-06-04 DIAGNOSIS — I10 Essential (primary) hypertension: Secondary | ICD-10-CM

## 2020-06-04 MED ORDER — IBUPROFEN 800 MG PO TABS: 800.0000 mg | ORAL_TABLET | Freq: Three times a day (TID) | ORAL | 0 refills | Status: DC | PRN

## 2020-10-21 ENCOUNTER — Other Ambulatory Visit: Payer: Self-pay

## 2020-10-21 ENCOUNTER — Ambulatory Visit: Payer: BC Managed Care – PPO | Admitting: Family Medicine

## 2020-10-21 ENCOUNTER — Telehealth: Payer: Self-pay | Admitting: Family Medicine

## 2020-10-21 ENCOUNTER — Encounter: Payer: Self-pay | Admitting: Family Medicine

## 2020-10-21 VITALS — BP 115/65 | Wt 217.0 lb

## 2020-10-21 DIAGNOSIS — L918 Other hypertrophic disorders of the skin: Secondary | ICD-10-CM | POA: Diagnosis not present

## 2020-10-21 DIAGNOSIS — R1013 Epigastric pain: Secondary | ICD-10-CM | POA: Diagnosis not present

## 2020-10-21 NOTE — Patient Instructions (Addendum)
It was wonderful to see you today.  Please bring ALL of your medications with you to every visit.   Today we talked about: Try pepcid (famotidine) 20 mg twice a day for four weeks   If your symptoms persist, please message me immediately---we will get some blood work   For your skin tag  You can wash the area tomorrow with clean soap and water  Place a new bandaid tomorrow  Call immediately if redness, drainage, or changes in pigment    Thank you for choosing [facility].   Please call [phone] with any questions about today's appointment.  Please be sure to schedule follow up at the front  desk before you leave today.   Terisa Starr, MD  Family Medicine

## 2020-10-21 NOTE — Progress Notes (Signed)
    SUBJECTIVE:   CHIEF COMPLAINT / HPI:   Frank Vargas is a very pleasant 50 yo with history of hypertension presenting for a skin tag and bloating.  He reports several months of bloating, this is new. Has associated acid like taste in his mouth. Initially thought to be due to meats but now notices this with vegetables. Does not notice change with spicey food. No abdominal pain, nausea, vomiting, weight loss, RUQ pain, or change in stools. Has a history of chronic constipation, idiopathic, followed by GI. Reports he feels bloated and full early after small amount foods. No excess EtOH or NSAID use. He has tried changing his diet. He is interested in weight loss.   Patient reports 2 weeks of bothersome skin tag. This gets caught on clothing, sometimes irritated and bothersome to him. Located on anterior right abdominal area. No prior similar lesions. No history of skin cancer. Has not changed in size. He would like removed.   PERTINENT  PMH / PSH/Family/Social History : HTN  Blood pressure at goal today   OBJECTIVE:   BP 115/65   Wt 217 lb (98.4 kg)   BMI 33.99 kg/m   Today's weight:  Last Weight  Most recent update: 10/21/2020 11:00 AM   Weight  98.4 kg (217 lb)           Review of prior weights: Filed Weights   10/21/20 1058  Weight: 217 lb (98.4 kg)   Cardiac: Warm well perfused.  Capillary refill less than 3 seconds Respiratory breathing comfortably on room air Psych: Pleasant normal affect, appropriate, normal rate of speech Abdomen: soft, non tender, no masses Right lower abdomen over anterior hip small 0.4 cm hyperpigmented acrochordon Skin tag removal Discussed risks complications Confirmed no allergies Area cleaned X3 with alcohol Using clean technique, the lesion was remove with sharp mosquitoes  Hemostasis easily attained   ASSESSMENT/PLAN:   Dyspepsia New problem, uncertain cause. Suspect GERD or functional dyspepsia but as this is new recommend close  monitoring and follow up testing. Also consider gastritis, PUD, Barrett's esophagus, less likely gallbladder. Exam benign.  - H2 blocker for 4 weeks - CBC and CMP if not improved--should be improved off of medication - Referral to GI if no change for concern for gastritis, H.pylori, or Barretts - Discussed LES pathophysiology    Acrochordon- suspect benign, discussed return precautions. Removed from R anterior hip today.   Due for a BMP in May, reminder to self.     Terisa Starr, MD  Family Medicine Teaching Service  [facility]

## 2020-10-21 NOTE — Assessment & Plan Note (Signed)
New problem, uncertain cause. Suspect GERD or functional dyspepsia but as this is new recommend close monitoring and follow up testing. Also consider gastritis, PUD, Barrett's esophagus, less likely gallbladder. Exam benign.  - H2 blocker for 4 weeks - CBC and CMP if not improved--should be improved off of medication - Referral to GI if no change for concern for gastritis, H.pylori, or Barretts - Discussed LES pathophysiology

## 2020-10-21 NOTE — Telephone Encounter (Signed)
Patient has bothersome skin tag that requires removal. Schedule for office visit--he will stop by when he has time off of work. [name] to see--call, page, or Teams when arrives.  Terisa Starr, MD  Family Medicine Teaching Service

## 2020-11-10 ENCOUNTER — Other Ambulatory Visit: Payer: Self-pay | Admitting: *Deleted

## 2020-11-10 ENCOUNTER — Encounter: Payer: Self-pay | Admitting: Family Medicine

## 2020-11-10 MED ORDER — IBUPROFEN 800 MG PO TABS: 800.0000 mg | ORAL_TABLET | Freq: Three times a day (TID) | ORAL | 0 refills | Status: DC | PRN

## 2020-12-16 ENCOUNTER — Telehealth: Payer: Self-pay | Admitting: Family Medicine

## 2020-12-16 DIAGNOSIS — R053 Chronic cough: Secondary | ICD-10-CM

## 2020-12-16 NOTE — Telephone Encounter (Signed)
Called patient regarding cough. Patient reports cough X3 weeks. He reports mucus, chest congestion, and wheezing. Scheduled for Thursday PM, ordered CXR, return precautions given.  Terisa Starr, MD  Family Medicine Teaching Service

## 2020-12-17 ENCOUNTER — Ambulatory Visit
Admission: RE | Admit: 2020-12-17 | Discharge: 2020-12-17 | Disposition: A | Discharge status: Home / Self Care | Payer: BC Managed Care – PPO | Source: Ambulatory Visit | Attending: Family Medicine | Admitting: Family Medicine

## 2020-12-17 DIAGNOSIS — R053 Chronic cough: Secondary | ICD-10-CM

## 2020-12-18 ENCOUNTER — Ambulatory Visit: Payer: BC Managed Care – PPO | Admitting: Family Medicine

## 2020-12-18 ENCOUNTER — Encounter: Payer: Self-pay | Admitting: Family Medicine

## 2020-12-18 ENCOUNTER — Other Ambulatory Visit: Payer: Self-pay

## 2020-12-18 ENCOUNTER — Ambulatory Visit (HOSPITAL_COMMUNITY)
Admission: RE | Admit: 2020-12-18 | Discharge: 2020-12-18 | Disposition: A | Discharge status: Home / Self Care | Payer: BC Managed Care – PPO | Source: Ambulatory Visit | Attending: Family Medicine | Admitting: Family Medicine

## 2020-12-18 VITALS — BP 110/82 | HR 60 | Resp 12 | Wt 215.0 lb

## 2020-12-18 DIAGNOSIS — R0600 Dyspnea, unspecified: Secondary | ICD-10-CM | POA: Diagnosis not present

## 2020-12-18 DIAGNOSIS — I10 Essential (primary) hypertension: Secondary | ICD-10-CM | POA: Insufficient documentation

## 2020-12-18 DIAGNOSIS — E0590 Thyrotoxicosis, unspecified without thyrotoxic crisis or storm: Secondary | ICD-10-CM

## 2020-12-18 DIAGNOSIS — R1013 Epigastric pain: Secondary | ICD-10-CM

## 2020-12-18 DIAGNOSIS — R053 Chronic cough: Secondary | ICD-10-CM

## 2020-12-18 DIAGNOSIS — R0609 Other forms of dyspnea: Secondary | ICD-10-CM | POA: Insufficient documentation

## 2020-12-18 MED ORDER — LORATADINE 10 MG PO TABS: 10.0000 mg | ORAL_TABLET | Freq: Every day | ORAL | 11 refills | Status: DC

## 2020-12-18 MED ORDER — BENZONATATE 100 MG PO CAPS: 100.0000 mg | ORAL_CAPSULE | Freq: Two times a day (BID) | ORAL | 0 refills | Status: DC | PRN

## 2020-12-18 NOTE — Patient Instructions (Addendum)
It was wonderful to see you today.  Please bring ALL of your medications with you to every visit.   Today we talked about:  -- Going to get blood work--I will call you with results   ---Going to get a heart ultrasound (echo)  This is at the hospital--go to Entrance C--enter through the Heart and Vascular Wing  Friday July 1 PM  [facility]  For your cough   -Tessalon pearles--you can take twice per day  - Flonase--2  Sprays twice per day  -- Claritin each night  If you are NOT better in 10-14 days send me a message   If you have a change in your symptoms or shortness of breath that changes--please call or return to care   Thank you for choosing [facility].   Please call [phone] with any questions about today's appointment.   Terisa Starr, MD  Family Medicine

## 2020-12-18 NOTE — Assessment & Plan Note (Signed)
This is improved from prior.

## 2020-12-18 NOTE — Assessment & Plan Note (Signed)
His cough could easily be a side effect from the lisinopril however the cough is productive which would be atypical for an ACE inhibitor induced cough.  We will try the measures outlined and switch to alternative if needed.  His blood pressure is well controlled with current regiment.  Repeat metabolic panel today.

## 2020-12-18 NOTE — Assessment & Plan Note (Signed)
What he describes as exertional fatigue, differential includes cardiac etiology.  EKG obtained without ST changes He has always been bradycardic and I doubt this is the cause of his symptoms. Given his family history must be concerned for cardiomyopathy, will obtain echocardiogram.  Pending symptoms will refer to cardiology for further evaluation. Other etiologies to consider include anemia, will obtain CBC He is already had a chest x-ray which did not show pulmonary pathology Other etiologies considered include sleep apnea, he denies a history of snoring. He does have limited sleep and we discussed that 3 hours of sleep may not be sustainable in the long-term.  He reports he probably will only work 2 jobs for a few more years

## 2020-12-18 NOTE — Assessment & Plan Note (Signed)
Likely due to postnasal drainage due to allergies and postviral illness--exam consistent with this. Also considered ACE inhibitor cough. Less likely GERD, bacterial or fungal pathology. COVID negative. - Trial Tessalon pearles - Antihistamine - Flonase BID - If not improved, considering changing ACE inhibitor then advanced imaging

## 2020-12-18 NOTE — Progress Notes (Signed)
SUBJECTIVE:   CHIEF COMPLAINT / HPI:   Frank Vargas is a pleasant 50 year old with history significant for hypertension, hyperthyroidism on very low-dose methimazole and intermittent vertigo presenting today for routine follow-up.  Bloating and epigastric discomfort The patient reports his bowels continue to cause him some difficulty.  He is taking Colace and [employer] brand stool softener.  He reports his epigastric pain and discomfort are markedly improved with intermittent use of H2 blocker.  He denies new symptoms.   Cough The patient reports since around May 14 he has had some mild congestion, sinus drainage and a cough.  The cough is the persistent portion of his symptoms.  This is quite bothersome and occurs all throughout the day.  He started off with some congestion but no significant nasal drainage.  He has had no fevers.  His cough continues to be productive, worse in the morning.  He noticed a slight amount of shortness of breath when this started.  He had a COVID test which was negative.  He denies chest pain, change in his dyspnea on exertion which is described below, asymmetric lower extremity edema.  He does note some lower extremity edema at times at the beginning or the end of the day.  This is bilateral and symmetric.  He denies ear pain, recurrence of his vertiginous symptoms, sinus pressure or nasal drainage.  Hypertension Patient is taking his lisinopril HCTZ combination pill.  He takes this each morning.  He reports good compliance.  He reports he has never had a problem with cough with this medication before.  He reports the cough is not dry.  He denies chest pain, headaches or vision changes  Exertional fatigue The patient reports for about a year now he has noticed a decrease in his functional abilities.  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  He typically runs a few miles on the weekend and regularly works out.  For the last 6 months he has been unable to run more  than a mile without feeling short of breath.  He is still able to walk up stairs and often does not notice this unless he really tries to exert himself.  No episodes of syncope, denies melena hematochezia.  he has no chest pain with these episodes.  He simply feels very exhausted and short of breath.  He feels like his breathing is heavy at the end of the mile.  He is able to breathe fine at the beginning of the mile.  He previously could run 3 miles easily.  He denies orthopnea or paroxysmal nocturnal dyspnea.  He has a family history of cardiomyopathy in his mother at age 36.  He is a non-smoker and does not use alcohol in excess.  The patient goes to sleep every night around 3 AM and wakes up at 6:30 AM.  He works regularly during the summer for [employer] but does get time off from school.  PERTINENT  PMH / PSH/Family/Social History : Updated and reviewed  OBJECTIVE:   BP 110/82   Pulse 60   Resp 12   Wt 215 lb (97.5 kg)   BMI 33.67 kg/m   Today's weight:  Last Weight  Most recent update: 12/18/2020  2:31 PM   Weight  97.5 kg (215 lb)           Review of prior weights: [employer]   12/18/20 1431  Weight: 215 lb (97.5 kg)    HEENT  Nasal  turbinates noted for bogginess, he has evidence of postnasal drip, no lymphadenopathy. Cardiac: Regular rate and rhythm. Normal S1/S2. No murmurs, rubs, or gallops appreciated. Lungs: Clear bilaterally to ascultation.  Abdomen: Normoactive bowel sounds. No tenderness to deep or light palpation. No rebound or guarding.   Psych: Pleasant and appropriate   EKG SR with suspected lead reversal, no ST changes   Chest x-ray reviewed and without abnormality  ASSESSMENT/PLAN:   Chronic cough Likely due to postnasal drainage due to allergies and postviral illness--exam consistent with this. Also considered ACE inhibitor cough. Less likely GERD, bacterial or fungal pathology. COVID negative. - Trial Tessalon pearles - Antihistamine - Flonase BID - If  not improved, considering changing ACE inhibitor then advanced imaging   Dyspepsia This is improved from prior.    HYPERTENSION, BENIGN His cough could easily be a side effect from the lisinopril however the cough is productive which would be atypical for an ACE inhibitor induced cough.  We will try the measures outlined and switch to alternative if needed.  His blood pressure is well controlled with current regiment.  Repeat metabolic panel today.  Dyspnea on exertion What he describes as exertional fatigue, differential includes cardiac etiology.  EKG obtained without ST changes He has always been bradycardic and I doubt this is the cause of his symptoms. Given his family history must be concerned for cardiomyopathy, will obtain echocardiogram.  Pending symptoms will refer to cardiology for further evaluation. Other etiologies to consider include anemia, will obtain CBC He is already had a chest x-ray which did not show pulmonary pathology Other etiologies considered include sleep apnea, he denies a history of snoring. He does have limited sleep and we discussed that 3 hours of sleep may not be sustainable in the long-term.  He reports he probably will only work 2 jobs for a few more years        Terisa Starr, MD  Family Medicine Teaching Service  [facility]

## 2020-12-19 LAB — CBC WITH DIFFERENTIAL/PLATELET
Basophils Absolute: 0.1 10*3/uL (ref 0.0–0.2)
Basos: 1 %
EOS (ABSOLUTE): 0.2 10*3/uL (ref 0.0–0.4)
Eos: 3 %
Hematocrit: 45.9 % (ref 37.5–51.0)
Hemoglobin: 15.3 g/dL (ref 13.0–17.7)
Immature Grans (Abs): 0 10*3/uL (ref 0.0–0.1)
Immature Granulocytes: 0 %
Lymphocytes Absolute: 2.7 10*3/uL (ref 0.7–3.1)
Lymphs: 43 %
MCH: 29.1 pg (ref 26.6–33.0)
MCHC: 33.3 g/dL (ref 31.5–35.7)
MCV: 87 fL (ref 79–97)
Monocytes Absolute: 1 10*3/uL — ABNORMAL HIGH (ref 0.1–0.9)
Monocytes: 16 %
Neutrophils Absolute: 2.3 10*3/uL (ref 1.4–7.0)
Neutrophils: 37 %
Platelets: 288 10*3/uL (ref 150–450)
RBC: 5.26 x10E6/uL (ref 4.14–5.80)
RDW: 13.6 % (ref 11.6–15.4)
WBC: 6.3 10*3/uL (ref 3.4–10.8)

## 2020-12-19 LAB — COMPREHENSIVE METABOLIC PANEL
ALT: 71 IU/L — ABNORMAL HIGH (ref 0–44)
AST: 40 IU/L (ref 0–40)
Albumin/Globulin Ratio: 1.6 (ref 1.2–2.2)
Albumin: 4.6 g/dL (ref 4.0–5.0)
Alkaline Phosphatase: 80 IU/L (ref 44–121)
BUN/Creatinine Ratio: 19 (ref 9–20)
BUN: 26 mg/dL — ABNORMAL HIGH (ref 6–24)
Bilirubin Total: 0.3 mg/dL (ref 0.0–1.2)
CO2: 25 mmol/L (ref 20–29)
Calcium: 9.7 mg/dL (ref 8.7–10.2)
Chloride: 99 mmol/L (ref 96–106)
Creatinine, Ser: 1.34 mg/dL — ABNORMAL HIGH (ref 0.76–1.27)
Globulin, Total: 2.8 g/dL (ref 1.5–4.5)
Glucose: 105 mg/dL — ABNORMAL HIGH (ref 65–99)
Potassium: 4 mmol/L (ref 3.5–5.2)
Sodium: 137 mmol/L (ref 134–144)
Total Protein: 7.4 g/dL (ref 6.0–8.5)
eGFR: 65 mL/min/{1.73_m2} (ref >59)

## 2020-12-19 LAB — LIPID PANEL
Chol/HDL Ratio: 4.3 ratio (ref 0.0–5.0)
Cholesterol, Total: 247 mg/dL — ABNORMAL HIGH (ref 100–199)
HDL: 57 mg/dL (ref >39)
LDL Chol Calc (NIH): 167 mg/dL — ABNORMAL HIGH (ref 0–99)
Triglycerides: 126 mg/dL (ref 0–149)
VLDL Cholesterol Cal: 23 mg/dL (ref 5–40)

## 2021-01-06 ENCOUNTER — Encounter: Payer: Self-pay | Admitting: Family Medicine

## 2021-01-06 MED ORDER — LOSARTAN POTASSIUM 50 MG PO TABS: 50.0000 mg | ORAL_TABLET | Freq: Every day | ORAL | 3 refills | Status: DC

## 2021-01-06 MED ORDER — HYDROCHLOROTHIAZIDE 12.5 MG PO CAPS: 12.5000 mg | ORAL_CAPSULE | Freq: Every day | ORAL | 3 refills | Status: DC

## 2021-01-06 NOTE — Telephone Encounter (Signed)
Called patient to discuss dry cough.  He has an ongoing persistent dry cough.  Evaluation thus far has been unrevealing.  Chest x-ray is negative.  He has been getting more sleep and feels like he has more energy.  He has been running and doing well.  Transition from combination ACE inhibitor HCTZ to HCTZ plus losartan.  He will start that we will start next week to minimize any potential increased risk for angioedema.  All questions were answered and reviewed the side effects of the medication at length.  He will repeat labs in July at which time he also needs a lipid panel.

## 2021-01-08 ENCOUNTER — Encounter: Payer: Self-pay | Admitting: Family Medicine

## 2021-01-13 ENCOUNTER — Encounter: Payer: Self-pay | Admitting: Family Medicine

## 2021-01-13 MED ORDER — MECLIZINE HCL 25 MG PO TABS: 25.0000 mg | ORAL_TABLET | Freq: Every day | ORAL | 3 refills | Status: DC | PRN

## 2021-01-16 ENCOUNTER — Ambulatory Visit (HOSPITAL_COMMUNITY)
Admission: RE | Admit: 2021-01-16 | Discharge: 2021-01-16 | Disposition: A | Discharge status: Home / Self Care | Payer: BC Managed Care – PPO | Source: Ambulatory Visit | Attending: Family Medicine | Admitting: Family Medicine

## 2021-01-16 ENCOUNTER — Telehealth: Payer: Self-pay | Admitting: Family Medicine

## 2021-01-16 ENCOUNTER — Other Ambulatory Visit: Payer: Self-pay

## 2021-01-16 DIAGNOSIS — R0609 Other forms of dyspnea: Secondary | ICD-10-CM | POA: Diagnosis not present

## 2021-01-16 LAB — ECHOCARDIOGRAM COMPLETE
AR max vel: 2.71 cm2
AV Area VTI: 2.56 cm2
AV Area mean vel: 2.49 cm2
AV Mean grad: 4 mmHg
AV Peak grad: 8.6 mmHg
Ao pk vel: 1.47 m/s
Area-P 1/2: 2.76 cm2
S' Lateral: 2.8 cm

## 2021-01-16 NOTE — Telephone Encounter (Signed)
Called with results. Echocardiogram returned normal. His symptoms improved, exercising regularly. Reminder to self to repeat labs in ~ 2 weeks.  Terisa Starr, MD  Family Medicine Teaching Service

## 2021-01-16 NOTE — Progress Notes (Signed)
  Echocardiogram 2D Echocardiogram has been performed.  Frank Vargas F 01/16/2021, 1:43 PM

## 2021-01-22 ENCOUNTER — Encounter: Payer: Self-pay | Admitting: Family Medicine

## 2021-01-22 ENCOUNTER — Other Ambulatory Visit: Payer: Self-pay | Admitting: Family Medicine

## 2021-01-22 DIAGNOSIS — R053 Chronic cough: Secondary | ICD-10-CM

## 2021-01-22 DIAGNOSIS — I10 Essential (primary) hypertension: Secondary | ICD-10-CM

## 2021-01-22 DIAGNOSIS — E785 Hyperlipidemia, unspecified: Secondary | ICD-10-CM

## 2021-01-28 ENCOUNTER — Other Ambulatory Visit: Payer: Self-pay

## 2021-01-28 ENCOUNTER — Other Ambulatory Visit: Payer: BC Managed Care – PPO

## 2021-01-28 DIAGNOSIS — R053 Chronic cough: Secondary | ICD-10-CM

## 2021-01-28 DIAGNOSIS — E785 Hyperlipidemia, unspecified: Secondary | ICD-10-CM

## 2021-01-28 DIAGNOSIS — I10 Essential (primary) hypertension: Secondary | ICD-10-CM

## 2021-01-29 ENCOUNTER — Telehealth: Payer: Self-pay | Admitting: Family Medicine

## 2021-01-29 DIAGNOSIS — J301 Allergic rhinitis due to pollen: Secondary | ICD-10-CM

## 2021-01-29 LAB — COMPREHENSIVE METABOLIC PANEL
ALT: 37 IU/L (ref 0–44)
AST: 32 IU/L (ref 0–40)
Albumin/Globulin Ratio: 1.6 (ref 1.2–2.2)
Albumin: 4.2 g/dL (ref 4.0–5.0)
Alkaline Phosphatase: 76 IU/L (ref 44–121)
BUN/Creatinine Ratio: 13 (ref 9–20)
BUN: 17 mg/dL (ref 6–24)
Bilirubin Total: 0.3 mg/dL (ref 0.0–1.2)
CO2: 27 mmol/L (ref 20–29)
Calcium: 9.5 mg/dL (ref 8.7–10.2)
Chloride: 103 mmol/L (ref 96–106)
Creatinine, Ser: 1.34 mg/dL — ABNORMAL HIGH (ref 0.76–1.27)
Globulin, Total: 2.6 g/dL (ref 1.5–4.5)
Glucose: 86 mg/dL (ref 65–99)
Potassium: 4.5 mmol/L (ref 3.5–5.2)
Sodium: 142 mmol/L (ref 134–144)
Total Protein: 6.8 g/dL (ref 6.0–8.5)
eGFR: 65 mL/min/{1.73_m2} (ref >59)

## 2021-01-29 LAB — CBC
Hematocrit: 42.1 % (ref 37.5–51.0)
Hemoglobin: 14.1 g/dL (ref 13.0–17.7)
MCH: 29.6 pg (ref 26.6–33.0)
MCHC: 33.5 g/dL (ref 31.5–35.7)
MCV: 88 fL (ref 79–97)
Platelets: 256 10*3/uL (ref 150–450)
RBC: 4.76 x10E6/uL (ref 4.14–5.80)
RDW: 13.7 % (ref 11.6–15.4)
WBC: 6.1 10*3/uL (ref 3.4–10.8)

## 2021-01-29 LAB — LDL CHOLESTEROL, DIRECT: LDL Direct: 124 mg/dL — ABNORMAL HIGH (ref 0–99)

## 2021-01-29 MED ORDER — MOMETASONE FUROATE 50 MCG/ACT NA SUSP: 2.0000 | Freq: Every day | NASAL | 12 refills | Status: DC

## 2021-01-29 NOTE — Telephone Encounter (Signed)
Called patient with results.  Confirm date of birth.  Overall blood work is improved.  His LDL is markedly improved congratulated him on this.  Recommend follow-up in 6 months.  Flonase is not covered by insurance alternative sent in.  His cough is improved with intranasal corticosteroids.  If returns or recurs recommended he call and return to care for further evaluation.

## 2021-02-05 ENCOUNTER — Encounter: Payer: Self-pay | Admitting: Family Medicine

## 2021-02-13 ENCOUNTER — Encounter: Payer: Self-pay | Admitting: Family Medicine

## 2021-02-27 ENCOUNTER — Encounter: Payer: Self-pay | Admitting: Family Medicine

## 2021-11-02 ENCOUNTER — Other Ambulatory Visit: Payer: Self-pay | Admitting: *Deleted

## 2021-11-02 MED ORDER — IBUPROFEN 800 MG PO TABS: 800.0000 mg | ORAL_TABLET | Freq: Three times a day (TID) | ORAL | 0 refills | Status: DC | PRN

## 2021-12-04 ENCOUNTER — Other Ambulatory Visit: Payer: Self-pay

## 2021-12-04 MED ORDER — MECLIZINE HCL 25 MG PO TABS: 25.0000 mg | ORAL_TABLET | Freq: Every day | ORAL | 3 refills | Status: DC | PRN

## 2021-12-22 ENCOUNTER — Encounter: Payer: Self-pay | Admitting: *Deleted

## 2021-12-28 ENCOUNTER — Other Ambulatory Visit: Payer: Self-pay | Admitting: *Deleted

## 2021-12-28 MED ORDER — HYDROCHLOROTHIAZIDE 12.5 MG PO CAPS: 12.5000 mg | ORAL_CAPSULE | Freq: Every day | ORAL | 3 refills | Status: DC

## 2021-12-28 MED ORDER — LOSARTAN POTASSIUM 50 MG PO TABS
50.0000 mg | ORAL_TABLET | Freq: Every day | ORAL | 3 refills | Status: DC
Start: 2021-12-28 — End: 2022-12-21

## 2021-12-28 NOTE — Telephone Encounter (Signed)
Medications refilled. Please call and have him schedule a visit this summer.  Thanks! Dorris Singh, MD  Family Medicine Teaching Service

## 2021-12-29 NOTE — Telephone Encounter (Signed)
Pt informed and scheduled. Cameryn Schum, CMA  

## 2022-01-22 ENCOUNTER — Ambulatory Visit: Payer: BC Managed Care – PPO | Admitting: Family Medicine

## 2022-01-22 ENCOUNTER — Encounter: Payer: Self-pay | Admitting: Family Medicine

## 2022-01-22 VITALS — BP 140/82 | HR 56 | Wt 218.2 lb

## 2022-01-22 DIAGNOSIS — Z0000 Encounter for general adult medical examination without abnormal findings: Secondary | ICD-10-CM

## 2022-01-22 DIAGNOSIS — R1319 Other dysphagia: Secondary | ICD-10-CM | POA: Diagnosis not present

## 2022-01-22 DIAGNOSIS — I10 Essential (primary) hypertension: Secondary | ICD-10-CM

## 2022-01-22 DIAGNOSIS — R1310 Dysphagia, unspecified: Secondary | ICD-10-CM

## 2022-01-22 MED ORDER — PANTOPRAZOLE SODIUM 40 MG PO TBEC: 40.0000 mg | DELAYED_RELEASE_TABLET | Freq: Every day | ORAL | 3 refills | Status: DC

## 2022-01-22 NOTE — Patient Instructions (Addendum)
It was wonderful to see you today.  Please bring ALL of your medications with you to every visit.   Today we talked about:  --Please monitor your blood pressure 2-3 times a week over the next two weeks and message me with results   Your blood pressure goal is <140/90--I would like to see your blood pressure <130/80 Reducing ibuprofen could help reduce your blood pressure    -- Going to see Gastroenterology for your swallowing ---I sent in a medicine to take in the morning 30 minutes before breakfast to help with this   --I will message you with lab results tomorrow    Please follow up in 6 months   Thank you for choosing [facility].   Please call [phone] with any questions about today's appointment.  Please be sure to schedule follow up at the front  desk before you leave today.   Terisa Starr, MD  Family Medicine

## 2022-01-22 NOTE — Assessment & Plan Note (Signed)
This is a new problem requiring further evaluation.  Differential includes stricture due to reflux, less likely eosinophilic esophagitis also considered Zenker's diverticulum.  Referral to gastroenterology for further evaluation and likely endoscopic evaluation.  Pantoprazole 40 prescribed for symptoms in meantime.  Reviewed return precautions.

## 2022-01-22 NOTE — Progress Notes (Signed)
    SUBJECTIVE:   Chief compliant/HPI: annual examination  ROYALE SWAMY is a 51 y.o. who presents today for an annual exam.   HTN: Patient reports he is taking his medications as prescribed.  Denies chest pain headaches dyspnea.  He has been using more ibuprofen related to rotator cuff injury on the left shoulder due to working at [employer].  The patient reported last year he had some dyspnea on exertion.  This is entirely resolved.  He is now back to running 5 miles.  The patient's only concern today is sometimes he feels like things are getting stuck behind his sternum.  He has a history of reflux.  He has tried PPI without relief.  He has not used this in about 11 months.  He denies vomiting, weight loss, melena or hematochezia.  He denies pain with swelling.  He feels that particularly spicy foods and thicker foods get stuck about one third of the way down his esophagus.  There is no family history of esophageal malignancy.  History tabs reviewed and updated.  Updated family history to note mother now has breast cancer.  Review of systems form reviewed and notable for noted as above..   OBJECTIVE:   BP 140/82   Pulse (!) 56   Wt 218 lb 3.2 oz (99 kg)   SpO2 100%   BMI 34.17 kg/m   HEENT: EOMI. Sclera without injection or icterus. MMM. External auditory canal examined and with cerumen. TM normal appearance, no erythema or bulging.  Cerumen removed from right external auditory canal. Neck: Supple.  Cardiac: Regular rate and rhythm. Normal S1/S2. No murmurs, rubs, or gallops appreciated. Lungs: Clear bilaterally to ascultation.  Abdomen: Normoactive bowel sounds. No tenderness to deep or light palpation. No rebound or guarding.   Neuro: Speech is fluent. Ext: No edema Psych: Pleasant and appropriate    ASSESSMENT/PLAN:   Dysphagia This is a new problem requiring further evaluation.  Differential includes stricture due to reflux, less likely eosinophilic esophagitis also considered  Zenker's diverticulum.  Referral to gastroenterology for further evaluation and likely endoscopic evaluation.  Pantoprazole 40 prescribed for symptoms in meantime.  Reviewed return precautions.  HYPERTENSION, BENIGN Near goal monitor at home. Will message to find out values, reminder to self.     Annual Examination  See AVS for recommendations.  PHQ score 0, reviewed.  Blood pressure value is <130/80 goal---discussed, monitor at home, will send values, consider 24 monitor   Considered the following screening exams based upon USPSTF recommendations: Diabetes screening: ordered Screening for elevated cholesterol: discussed and ordered Reviewed risk factors for latent tuberculosis and not indicated Colorectal cancer screening:  previously  if age 36 or over or risk factors.  Immunizations UTD.   Follow up in 1 year or sooner if indicated.    Westley Chandler, MD [facility]

## 2022-01-22 NOTE — Assessment & Plan Note (Signed)
Near goal monitor at home. Will message to find out values, reminder to self.

## 2022-01-23 LAB — BASIC METABOLIC PANEL
BUN/Creatinine Ratio: 13 (ref 9–20)
BUN: 16 mg/dL (ref 6–24)
CO2: 23 mmol/L (ref 20–29)
Calcium: 9 mg/dL (ref 8.7–10.2)
Chloride: 100 mmol/L (ref 96–106)
Creatinine, Ser: 1.24 mg/dL (ref 0.76–1.27)
Glucose: 91 mg/dL (ref 70–99)
Potassium: 4 mmol/L (ref 3.5–5.2)
Sodium: 139 mmol/L (ref 134–144)
eGFR: 71 mL/min/{1.73_m2} (ref >59)

## 2022-01-23 LAB — LIPID PANEL
Chol/HDL Ratio: 4.3 ratio (ref 0.0–5.0)
Cholesterol, Total: 216 mg/dL — ABNORMAL HIGH (ref 100–199)
HDL: 50 mg/dL (ref >39)
LDL Chol Calc (NIH): 149 mg/dL — ABNORMAL HIGH (ref 0–99)
Triglycerides: 93 mg/dL (ref 0–149)
VLDL Cholesterol Cal: 17 mg/dL (ref 5–40)

## 2022-01-23 LAB — HEMOGLOBIN A1C
Est. average glucose Bld gHb Est-mCnc: 126 mg/dL
Hgb A1c MFr Bld: 6 % — ABNORMAL HIGH (ref 4.8–5.6)

## 2022-01-25 ENCOUNTER — Telehealth: Payer: Self-pay | Admitting: Family Medicine

## 2022-01-25 DIAGNOSIS — E785 Hyperlipidemia, unspecified: Secondary | ICD-10-CM

## 2022-01-25 MED ORDER — ATORVASTATIN CALCIUM 20 MG PO TABS: 20.0000 mg | ORAL_TABLET | Freq: Every day | ORAL | 3 refills | Status: DC

## 2022-01-25 NOTE — Telephone Encounter (Signed)
Called patient. ASCVD 10%. Recommended statin, Rx sent. All questions answered.   Terisa Starr, MD  Family Medicine Teaching Service

## 2022-01-29 ENCOUNTER — Encounter: Payer: Self-pay | Admitting: Family Medicine

## 2022-01-29 DIAGNOSIS — I10 Essential (primary) hypertension: Secondary | ICD-10-CM

## 2022-01-29 MED ORDER — AMLODIPINE BESYLATE 5 MG PO TABS: 5.0000 mg | ORAL_TABLET | Freq: Every day | ORAL | 3 refills | Status: DC

## 2022-01-29 NOTE — Telephone Encounter (Signed)
Called patient regarding BP. No HA, CP, dyspnea, blurry vision. Discussed next steps, added amlodipine, he will check BP in next week once medication is active. All questions answered, reviewed return precautions for weekend.  Terisa Starr, MD  Family Medicine Teaching Service

## 2022-02-16 ENCOUNTER — Other Ambulatory Visit: Payer: Self-pay | Admitting: *Deleted

## 2022-02-16 DIAGNOSIS — R1319 Other dysphagia: Secondary | ICD-10-CM

## 2022-02-16 MED ORDER — PANTOPRAZOLE SODIUM 40 MG PO TBEC: 40.0000 mg | DELAYED_RELEASE_TABLET | Freq: Every day | ORAL | 3 refills | Status: DC

## 2022-03-09 ENCOUNTER — Other Ambulatory Visit: Payer: Self-pay | Admitting: Family Medicine

## 2022-03-09 MED ORDER — IBUPROFEN 800 MG PO TABS
800.0000 mg | ORAL_TABLET | Freq: Three times a day (TID) | ORAL | 0 refills | Status: DC | PRN
Start: 2022-03-09 — End: 2022-06-22

## 2022-04-05 ENCOUNTER — Other Ambulatory Visit: Payer: Self-pay | Admitting: *Deleted

## 2022-04-05 DIAGNOSIS — J301 Allergic rhinitis due to pollen: Secondary | ICD-10-CM

## 2022-04-05 MED ORDER — MOMETASONE FUROATE 50 MCG/ACT NA SUSP: 2.0000 | Freq: Every day | NASAL | 12 refills | Status: DC

## 2022-06-22 ENCOUNTER — Other Ambulatory Visit: Payer: Self-pay | Admitting: *Deleted

## 2022-06-22 MED ORDER — IBUPROFEN 800 MG PO TABS: 800.0000 mg | ORAL_TABLET | Freq: Three times a day (TID) | ORAL | 0 refills | Status: DC | PRN

## 2022-07-06 ENCOUNTER — Other Ambulatory Visit: Payer: Self-pay

## 2022-07-06 DIAGNOSIS — R1319 Other dysphagia: Secondary | ICD-10-CM

## 2022-07-06 MED ORDER — PANTOPRAZOLE SODIUM 40 MG PO TBEC: 40.0000 mg | DELAYED_RELEASE_TABLET | Freq: Every day | ORAL | 3 refills | Status: DC

## 2022-08-25 ENCOUNTER — Other Ambulatory Visit: Payer: Self-pay | Admitting: Family Medicine

## 2022-08-25 MED ORDER — IBUPROFEN 800 MG PO TABS: 800.0000 mg | ORAL_TABLET | Freq: Three times a day (TID) | ORAL | 0 refills | Status: DC | PRN

## 2022-08-26 ENCOUNTER — Other Ambulatory Visit: Payer: Self-pay | Admitting: Family Medicine

## 2022-08-26 NOTE — Progress Notes (Signed)
Ibuprofen already refilled

## 2022-09-16 ENCOUNTER — Other Ambulatory Visit: Payer: Self-pay | Admitting: *Deleted

## 2022-09-16 DIAGNOSIS — I10 Essential (primary) hypertension: Secondary | ICD-10-CM

## 2022-09-16 DIAGNOSIS — E785 Hyperlipidemia, unspecified: Secondary | ICD-10-CM

## 2022-09-16 MED ORDER — AMLODIPINE BESYLATE 5 MG PO TABS: 5.0000 mg | ORAL_TABLET | Freq: Every day | ORAL | 3 refills | Status: DC

## 2022-09-16 MED ORDER — ATORVASTATIN CALCIUM 20 MG PO TABS: 20.0000 mg | ORAL_TABLET | Freq: Every day | ORAL | 3 refills | Status: DC

## 2022-11-04 ENCOUNTER — Encounter: Payer: Self-pay | Admitting: Student

## 2022-11-04 ENCOUNTER — Ambulatory Visit: Payer: BC Managed Care – PPO | Admitting: Student

## 2022-11-04 DIAGNOSIS — J301 Allergic rhinitis due to pollen: Secondary | ICD-10-CM

## 2022-11-04 DIAGNOSIS — R1319 Other dysphagia: Secondary | ICD-10-CM

## 2022-11-04 MED ORDER — MOMETASONE FUROATE 50 MCG/ACT NA SUSP: 2.0000 | Freq: Every day | NASAL | 12 refills | Status: DC

## 2022-11-04 MED ORDER — CETIRIZINE HCL 10 MG PO TABS: 10.0000 mg | ORAL_TABLET | Freq: Every day | ORAL | 5 refills | Status: DC | PRN

## 2022-11-04 MED ORDER — PANTOPRAZOLE SODIUM 40 MG PO TBEC: 40.0000 mg | DELAYED_RELEASE_TABLET | Freq: Every day | ORAL | 1 refills | Status: DC

## 2022-11-04 MED ORDER — IBUPROFEN 800 MG PO TABS
800.0000 mg | ORAL_TABLET | Freq: Three times a day (TID) | ORAL | 0 refills | Status: DC | PRN
Start: 2022-11-04 — End: 2023-02-07

## 2022-11-04 NOTE — Patient Instructions (Signed)
It was great to see you! Thank you for allowing me to participate in your care!  It's sounding like your congestion and cough are coming from allergies. We'll try allergy meds, if not better in 2 weeks, we'll have you follow up.  Our plans for today:  - Allergies  Start Zyrtec 10 mg daily  Continue Nasonex daily  Follow up in 2 weeks if not better  - Shoulder pain  Consider using lidocaine patch on shoulder (Salonpas)  Can wear for 12 hours at a time, and one patch a day     Take care and seek immediate care sooner if you develop any concerns.   Dr. Bess Kinds, MD [facility]

## 2022-11-04 NOTE — Assessment & Plan Note (Signed)
Patient presents w/ cough and congestion that have been an issue since he had Covid a month ago. Cough is productive, and he appreciates some intermittent SOB lasting less than 30 sec, that resolves w/ a good breath. He denies any night time symptoms of wheezing or SOB. He denies any fevers/malaise. Given patient's lack of systemic symptoms and nasal symptoms, low concern for PNA. Sinuses are normal, no pressure or TTP, making sinus infection less likely. Patient most likely suffering from allergies and has hx of allergies. Reports he had not been taking his nasonex. Will restart nasonex and start Zyrtec. -Zyrtec 10 mg daily -Nasonex  -f/u 2 wk if not better

## 2022-11-04 NOTE — Progress Notes (Signed)
SUBJECTIVE:   CHIEF COMPLAINT / HPI:   Persistent Covid Symptoms Feels like he had Covid a month ago, (malaise, no taste/smell), didn't take a test, was ill for 3 days. During that time he was coughing hard. Is still having to congestion in nose and then coughing up from chest. Malaise has gotten better, but still feels a little off and is wondering if its the pollen. Denies wheezing, but appreciates some SOB intermittently for 30 sec or so and some wheezing last week, but the wheezing has now resolved. SOB comes and goes on it's own. Denies any symptoms at night.   PERTINENT  PMH / PSH: HTN, Allergies   Patient Care Team: Westley Chandler, MD as PCP - General (Family Medicine) OBJECTIVE:  BP 122/71   Pulse (!) 56   Ht  (1.702 m)   Wt 222 lb 9.6 oz (101 kg)   SpO2 97%   BMI 34.86 kg/m  Physical Exam Constitutional:      General: He is not in acute distress.    Appearance: Normal appearance. He is not ill-appearing.  HENT:     Nose: No congestion.     Left Turbinates: Enlarged and pale.     Right Sinus: No maxillary sinus tenderness or frontal sinus tenderness.     Left Sinus: No maxillary sinus tenderness or frontal sinus tenderness.     Mouth/Throat:     Mouth: Mucous membranes are moist.     Pharynx: Oropharynx is clear. No oropharyngeal exudate or posterior oropharyngeal erythema.  Cardiovascular:     Rate and Rhythm: Normal rate and regular rhythm.     Pulses: Normal pulses.     Heart sounds: Normal heart sounds. No murmur heard.    No friction rub. No gallop.  Pulmonary:     Effort: Pulmonary effort is normal. No respiratory distress.     Breath sounds: Normal breath sounds. No stridor. No wheezing, rhonchi or rales.  Neurological:     Mental Status: He is alert.      ASSESSMENT/PLAN:  Seasonal allergic rhinitis due to pollen Assessment & Plan: Patient presents w/ cough and congestion that have been an issue since he had Covid a month ago. Cough is  productive, and he appreciates some intermittent SOB lasting less than 30 sec, that resolves w/ a good breath. He denies any night time symptoms of wheezing or SOB. He denies any fevers/malaise. Given patient's lack of systemic symptoms and nasal symptoms, low concern for PNA. Sinuses are normal, no pressure or TTP, making sinus infection less likely. Patient most likely suffering from allergies and has hx of allergies. Reports he had not been taking his nasonex. Will restart nasonex and start Zyrtec. -Zyrtec 10 mg daily -Nasonex  -f/u 2 wk if not better  Orders: -     Mometasone Furoate; Place 2 sprays into the nose daily.  Dispense: 1 each; Refill: 12  Esophageal dysphagia -     Pantoprazole Sodium; Take 1 tablet (40 mg total) by mouth daily.  Dispense: 30 tablet; Refill: 1  Other orders -     Cetirizine HCl; Take 1 tablet (10 mg total) by mouth daily as needed for allergies.  Dispense: 30 tablet; Refill: 5 -     Ibuprofen; Take 1 tablet (800 mg total) by mouth every 8 (eight) hours as needed (take with food and drink water when taking).  Dispense: 30 tablet; Refill: 0   No follow-ups on file. Bess Kinds, MD 11/04/2022, 4:46 PM PGY-2, [facility]

## 2022-12-21 ENCOUNTER — Other Ambulatory Visit: Payer: Self-pay

## 2022-12-21 MED ORDER — HYDROCHLOROTHIAZIDE 12.5 MG PO CAPS: 12.5000 mg | ORAL_CAPSULE | Freq: Every day | ORAL | 3 refills | Status: DC

## 2022-12-21 MED ORDER — LOSARTAN POTASSIUM 50 MG PO TABS: 50.0000 mg | ORAL_TABLET | Freq: Every day | ORAL | 3 refills | Status: DC

## 2022-12-30 ENCOUNTER — Other Ambulatory Visit: Payer: Self-pay | Admitting: Student

## 2022-12-30 DIAGNOSIS — R1319 Other dysphagia: Secondary | ICD-10-CM

## 2023-01-12 ENCOUNTER — Other Ambulatory Visit: Payer: Self-pay | Admitting: *Deleted

## 2023-01-13 MED ORDER — MECLIZINE HCL 25 MG PO TABS: 25.0000 mg | ORAL_TABLET | Freq: Every day | ORAL | 3 refills | Status: DC | PRN

## 2023-02-03 ENCOUNTER — Other Ambulatory Visit: Payer: Self-pay | Admitting: Student

## 2023-04-14 IMAGING — CR DG CHEST 2V
2 series · 2 of 2 positions shown · non-contrast
Comparison: PA and lateral chest 02/10/2018.

CLINICAL DATA: Chronic cough and congestion.

EXAM:
CHEST - 2 VIEW

[w chest pa]
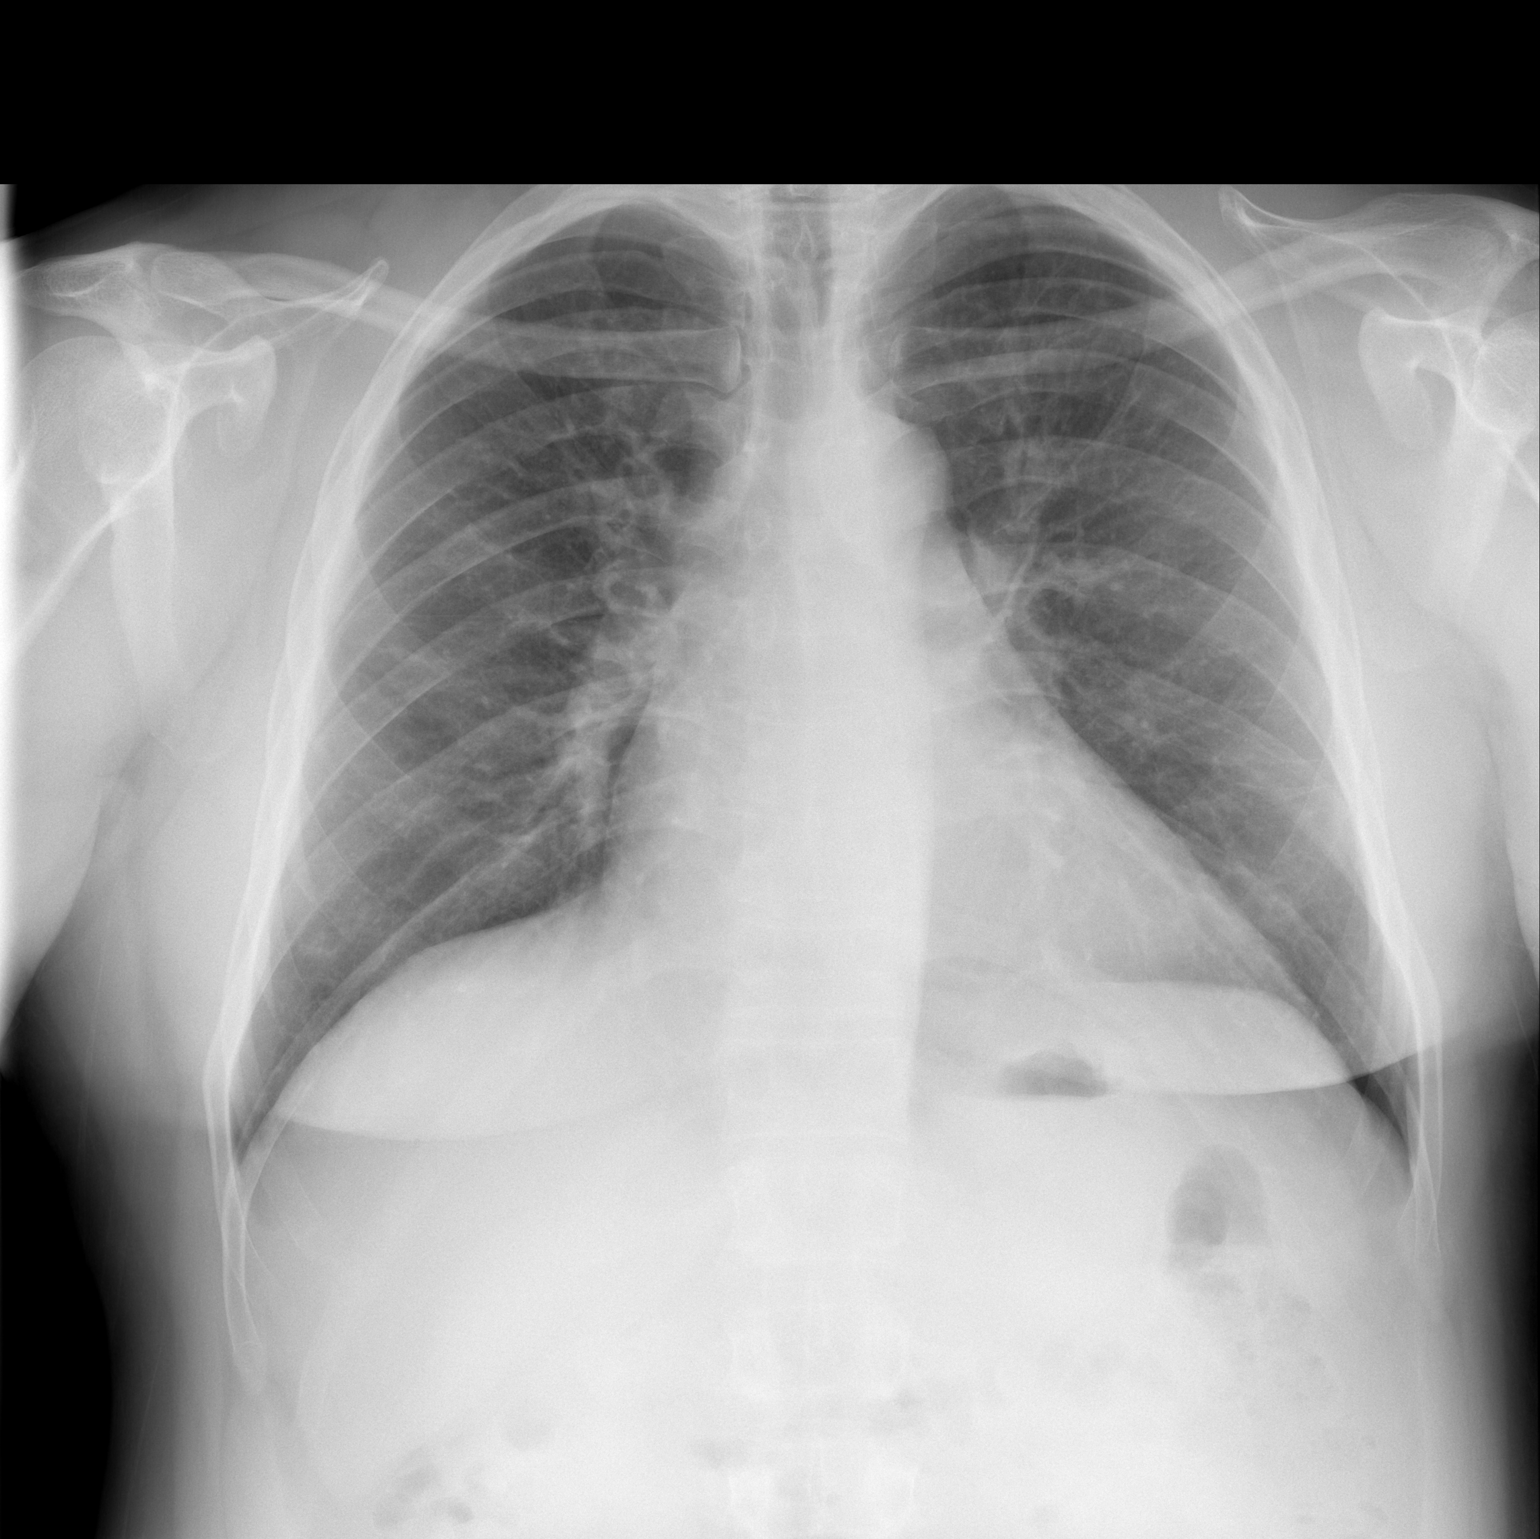

[w chest lat]
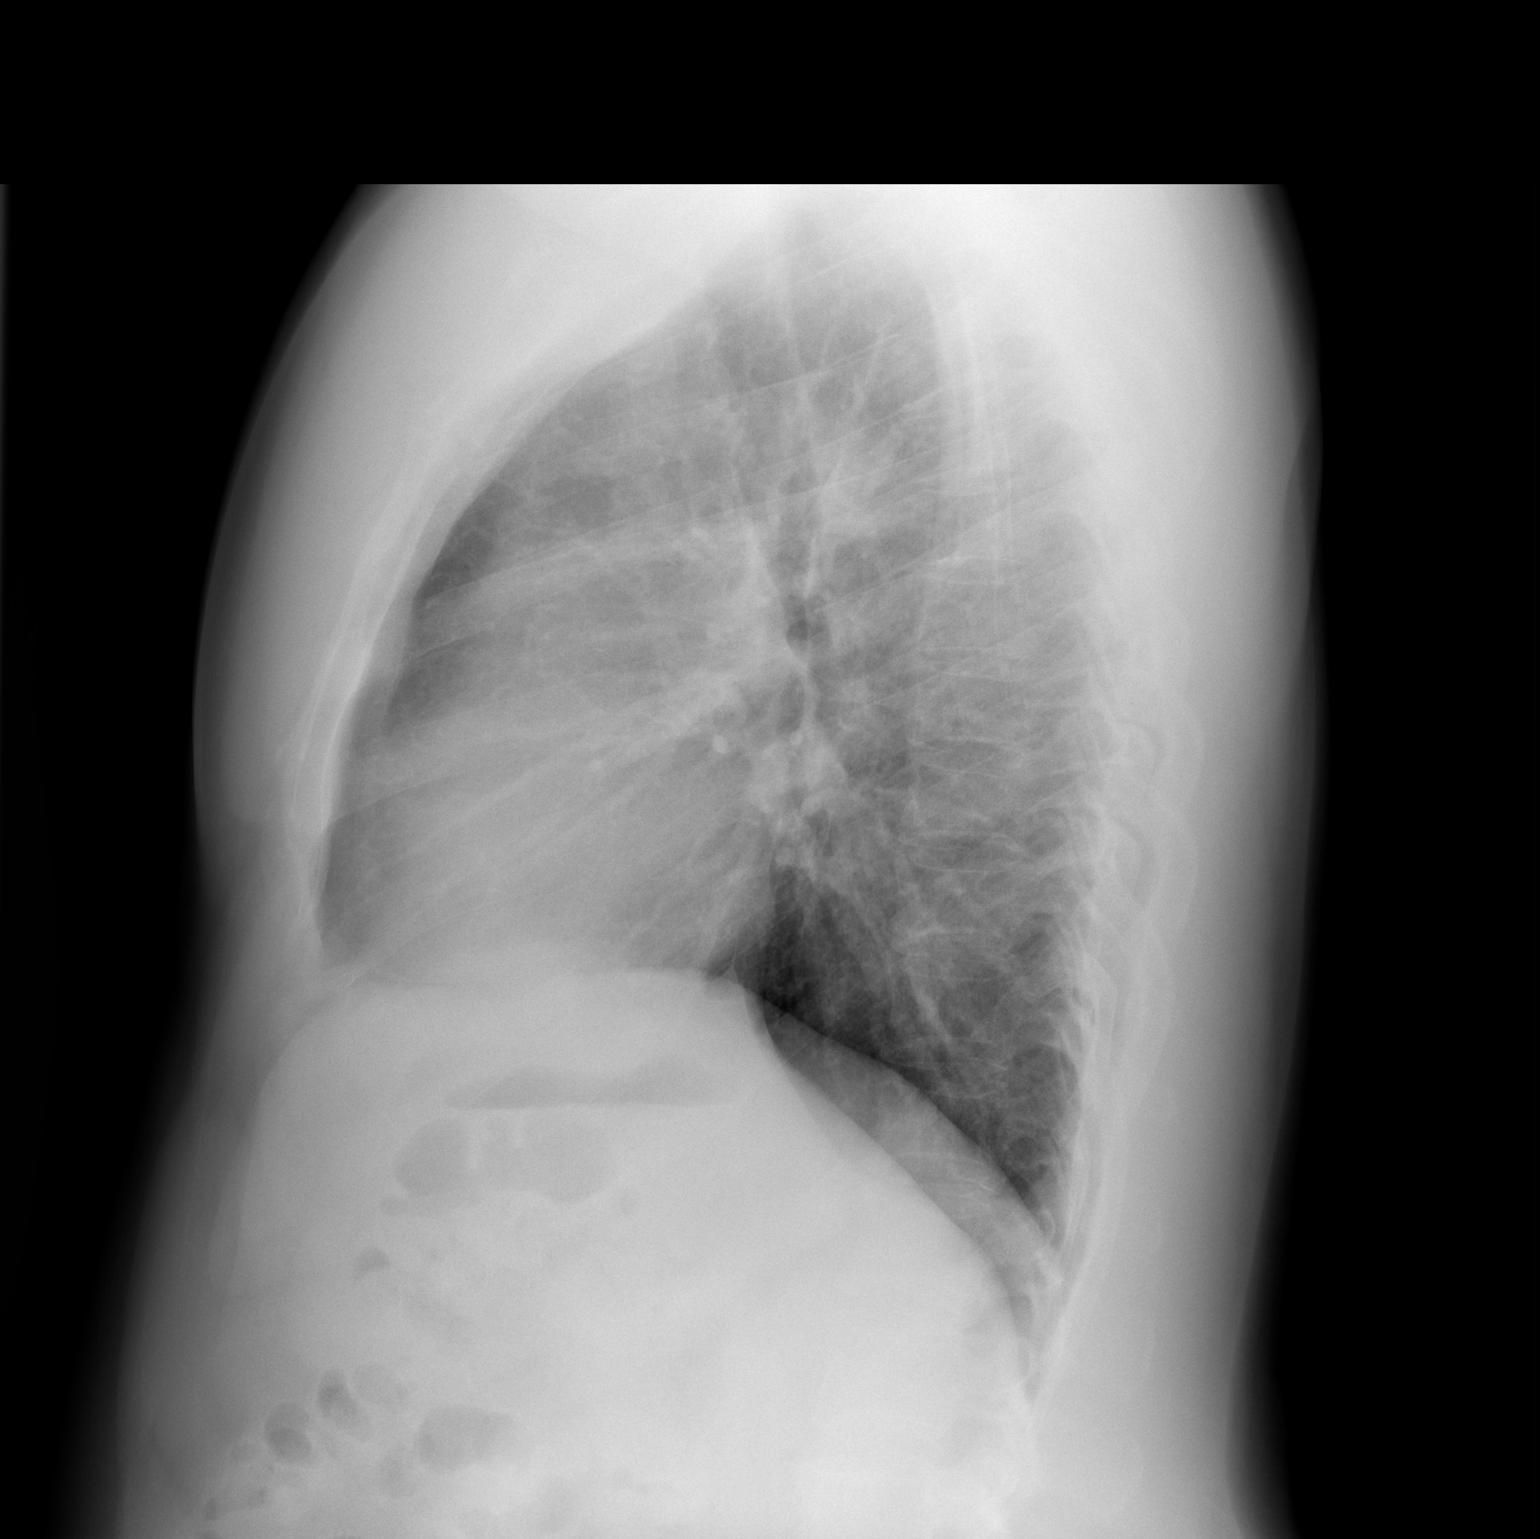

[2 of 2 positions shown; findings below may reference images not displayed]

FINDINGS: The lungs are clear. Heart size is normal. No pneumothorax or
pleural effusion. No acute or focal bony abnormality.
IMPRESSION: Negative chest.

## 2023-05-19 ENCOUNTER — Other Ambulatory Visit: Payer: Self-pay | Admitting: Student

## 2023-06-05 ENCOUNTER — Encounter: Payer: Self-pay | Admitting: Student

## 2023-06-05 ENCOUNTER — Other Ambulatory Visit: Payer: Self-pay | Admitting: Student

## 2023-06-06 MED ORDER — IBUPROFEN 800 MG PO TABS: 800.0000 mg | ORAL_TABLET | Freq: Three times a day (TID) | ORAL | 0 refills | Status: DC | PRN

## 2023-06-06 MED ORDER — MECLIZINE HCL 25 MG PO TABS: 25.0000 mg | ORAL_TABLET | Freq: Every day | ORAL | 3 refills | Status: AC | PRN

## 2023-06-15 ENCOUNTER — Other Ambulatory Visit: Payer: Self-pay | Admitting: Family Medicine

## 2023-06-15 DIAGNOSIS — E785 Hyperlipidemia, unspecified: Secondary | ICD-10-CM

## 2023-06-15 DIAGNOSIS — I10 Essential (primary) hypertension: Secondary | ICD-10-CM

## 2023-06-27 ENCOUNTER — Other Ambulatory Visit: Payer: Self-pay | Admitting: Student

## 2023-06-27 DIAGNOSIS — R1319 Other dysphagia: Secondary | ICD-10-CM

## 2023-07-21 NOTE — Progress Notes (Deleted)
  SUBJECTIVE:   CHIEF COMPLAINT / HPI:   Referral to Gastroenterology?  PERTINENT  PMH / PSH: ***  Past Medical History:  Diagnosis Date  . Hypertension   . Hyperthyroidism   . Vertigo    OBJECTIVE:  There were no vitals taken for this visit. Physical Exam   ASSESSMENT/PLAN:   Assessment & Plan  No follow-ups on file. Penne Rhein, MD 07/21/2023, 6:39 PM PGY-***, [facility] {    This will disappear when note is signed, click to select method of visit    :1}

## 2023-07-22 ENCOUNTER — Ambulatory Visit: Payer: BC Managed Care – PPO | Admitting: Student

## 2023-07-27 ENCOUNTER — Other Ambulatory Visit: Payer: Self-pay | Admitting: Family Medicine

## 2023-07-27 DIAGNOSIS — R1319 Other dysphagia: Secondary | ICD-10-CM

## 2023-08-05 ENCOUNTER — Ambulatory Visit (INDEPENDENT_AMBULATORY_CARE_PROVIDER_SITE_OTHER): Payer: BC Managed Care – PPO | Admitting: Student

## 2023-08-05 VITALS — BP 118/84 | HR 51 | Ht 67.0 in | Wt 224.2 lb

## 2023-08-05 DIAGNOSIS — K5901 Slow transit constipation: Secondary | ICD-10-CM | POA: Diagnosis not present

## 2023-08-05 DIAGNOSIS — R1319 Other dysphagia: Secondary | ICD-10-CM | POA: Diagnosis not present

## 2023-08-05 DIAGNOSIS — R5383 Other fatigue: Secondary | ICD-10-CM | POA: Diagnosis not present

## 2023-08-05 MED ORDER — PANTOPRAZOLE SODIUM 40 MG PO TBEC: 40.0000 mg | DELAYED_RELEASE_TABLET | Freq: Every day | ORAL | 1 refills | Status: DC

## 2023-08-05 NOTE — Patient Instructions (Signed)
It was great to see you! Thank you for allowing me to participate in your care!  I recommend that you always bring your medications to each appointment as this makes it easy to ensure we are on the correct medications and helps [place] not miss when refills are needed.  Our plans for today:  - GI issues  It seems like your body needs some support to have bowel movements. I recommend you continue to take the colace or miralax, regularly. If you stop having regular bowel movements or start developing new symptoms (pain/bleeding/worsening constipation), make a follow up appointment   - Energy Level This is likely low because you are not getting enough sleep. You will want to try to find time to get more sleep. This is the best thing you can do for your energy. If not improving, after getting more sleep, make follow up and we will look into this!   Take care and seek immediate care sooner if you develop any concerns.   Dr. Bess Kinds, MD [facility]

## 2023-08-05 NOTE — Assessment & Plan Note (Addendum)
Patient comes in for follow-up of slow transit constipation.  Patient appreciates that he has a few bowel movements if he does not take stool softener/stimulant.  Patient appreciate he takes over-the-counter Colace, regularly, and has regular bowel movements.  Denies any blood or difficulty having bowel movements.  Patient reports he is seen by GI, had colonoscopy that was normal, and was told that he will just need to take medicine for constipation.  Patient okay to continue this plan, no complaints. - Continue Colace OTC daily - Follow-up as needed

## 2023-08-05 NOTE — Assessment & Plan Note (Addendum)
Patient appreciates his low energy, but also appreciates he does not get enough sleep (roughly 3 hours a night).  Patient appreciates she feels like he needs more sleep, but works 2 jobs, full-time, for pay and benefits.  Patient was previously told that this is bad for his health, and agrees.  Patient attending to attempt to tough it out until his high school trial is through college.  Discussed the effect of poor sleep on health, and need for more sleep.  Patient in agreement. - Follow-up as needed

## 2023-08-05 NOTE — Progress Notes (Signed)
  SUBJECTIVE:   CHIEF COMPLAINT / HPI:   GI Issues Has difficulty w/ BM, has been taking colace and will have a BM daily. Had used miralax but stopped for cost. If not taking stool softener/stimulant has no BM and resets for 3-4 days until next BM. Declines referral to GI.  Energy Level 2 full time jobs, get's a few (3-4) hours of sleep. Feels like he needs more sleep. 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    PERTINENT  PMH / PSH:    OBJECTIVE:  BP 118/84   Pulse (!) 51   Ht 5\' 7"  (1.702 m)   Wt 224 lb 3.2 oz (101.7 kg)   SpO2 95%   BMI 35.11 kg/m  Physical Exam Constitutional:      General: He is not in acute distress.    Appearance: Normal appearance. He is not ill-appearing.  Pulmonary:     Effort: Pulmonary effort is normal.  Neurological:     Mental Status: He is alert.  Psychiatric:        Mood and Affect: Mood normal.        Behavior: Behavior normal.      ASSESSMENT/PLAN:   Assessment & Plan Slow transit constipation Patient comes in for follow-up of slow transit constipation.  Patient appreciates that he has a few bowel movements if he does not take stool softener/stimulant.  Patient appreciate he takes over-the-counter Colace, regularly, and has regular bowel movements.  Denies any blood or difficulty having bowel movements.  Patient reports he is seen by GI, had colonoscopy that was normal, and was told that he will just need to take medicine for constipation.  Patient okay to continue this plan, no complaints. - Continue Colace OTC daily - Follow-up as needed Low energy Patient appreciates his low energy, but also appreciates he does not get enough sleep (roughly 3 hours a night).  Patient appreciates she feels like he needs more sleep, but works 2 jobs, full-time, for pay and benefits.  Patient was previously told that this is bad for his health, and agrees.  Patient attending to attempt to tough it out until his high school trial is through  college.  Discussed the effect of poor sleep on health, and need for more sleep.  Patient in agreement. - Follow-up as needed No follow-ups on file. Bess Kinds, MD 08/05/2023, 4:30 PM PGY-3, [facility]

## 2023-10-02 ENCOUNTER — Other Ambulatory Visit: Payer: Self-pay | Admitting: Student

## 2023-10-19 ENCOUNTER — Other Ambulatory Visit: Payer: Self-pay | Admitting: Student

## 2023-11-05 ENCOUNTER — Other Ambulatory Visit: Payer: Self-pay | Admitting: Student

## 2023-11-05 DIAGNOSIS — J301 Allergic rhinitis due to pollen: Secondary | ICD-10-CM

## 2023-12-09 ENCOUNTER — Other Ambulatory Visit: Payer: Self-pay | Admitting: Student

## 2023-12-27 ENCOUNTER — Encounter: Payer: Self-pay | Admitting: *Deleted

## 2024-01-06 ENCOUNTER — Encounter: Payer: Self-pay | Admitting: Student

## 2024-01-06 ENCOUNTER — Other Ambulatory Visit: Payer: Self-pay | Admitting: Student

## 2024-01-06 DIAGNOSIS — I10 Essential (primary) hypertension: Secondary | ICD-10-CM

## 2024-01-06 DIAGNOSIS — M75101 Unspecified rotator cuff tear or rupture of right shoulder, not specified as traumatic: Secondary | ICD-10-CM

## 2024-01-06 NOTE — Progress Notes (Signed)
 Patient requesting refill of ibuprofen  for shoulder pain, will need to obtain BMP for renal function prior to.   Orders sent

## 2024-01-17 ENCOUNTER — Other Ambulatory Visit: Payer: Self-pay | Admitting: Student

## 2024-04-19 ENCOUNTER — Ambulatory Visit: Payer: Self-pay | Admitting: Family Medicine

## 2024-04-19 VITALS — BP 130/88 | HR 50 | Ht 68.0 in | Wt 216.4 lb

## 2024-04-19 DIAGNOSIS — E669 Obesity, unspecified: Secondary | ICD-10-CM | POA: Diagnosis not present

## 2024-04-19 DIAGNOSIS — I10 Essential (primary) hypertension: Secondary | ICD-10-CM

## 2024-04-19 LAB — POCT GLYCOSYLATED HEMOGLOBIN (HGB A1C): Hemoglobin A1C: 6.3 % — AB (ref 4.0–5.6)

## 2024-04-19 NOTE — Progress Notes (Signed)
 SABRA

## 2024-05-21 ENCOUNTER — Encounter: Payer: Self-pay | Admitting: Family Medicine

## 2024-05-21 ENCOUNTER — Ambulatory Visit: Payer: Self-pay | Admitting: Family Medicine

## 2024-05-21 VITALS — BP 132/88 | HR 50 | Wt 218.8 lb

## 2024-05-21 DIAGNOSIS — Z7985 Long-term (current) use of injectable non-insulin antidiabetic drugs: Secondary | ICD-10-CM

## 2024-05-21 DIAGNOSIS — M25511 Pain in right shoulder: Secondary | ICD-10-CM

## 2024-05-21 DIAGNOSIS — R7303 Prediabetes: Secondary | ICD-10-CM

## 2024-05-21 DIAGNOSIS — E669 Obesity, unspecified: Secondary | ICD-10-CM

## 2024-05-21 DIAGNOSIS — Z0000 Encounter for general adult medical examination without abnormal findings: Secondary | ICD-10-CM | POA: Diagnosis not present

## 2024-05-21 DIAGNOSIS — E7800 Pure hypercholesterolemia, unspecified: Secondary | ICD-10-CM

## 2024-05-21 DIAGNOSIS — E119 Type 2 diabetes mellitus without complications: Secondary | ICD-10-CM

## 2024-05-21 MED ORDER — TIRZEPATIDE-WEIGHT MANAGEMENT 2.5 MG/0.5ML ~~LOC~~ SOLN: 2.5000 mg | SUBCUTANEOUS | 0 refills | Status: DC

## 2024-05-21 MED ORDER — IBUPROFEN 800 MG PO TABS: 800.0000 mg | ORAL_TABLET | Freq: Three times a day (TID) | ORAL | 2 refills | Status: AC | PRN

## 2024-05-21 NOTE — Progress Notes (Signed)
 Name: Frank Vargas   Date of Visit: 05/21/24   Date of last visit with me: 04/19/2024   CHIEF COMPLAINT:  Chief Complaint  Patient presents with   Acute Visit    Blood pressure.        HPI:  Discussed the use of AI scribe software for clinical note transcription with the patient, who gave verbal consent to proceed.  History of Present Illness Frank Vargas is a 53 year old male with prediabetes and hypertension who presents for a follow-up visit.  He has been experiencing inconsistent blood pressure readings at home due to an ill-fitting cuff and has not been regularly monitoring it. He stopped one of his three blood pressure medications, which he did not like, and has noticed weight gain since then.  He is aware of his prediabetic status, with a recent hemoglobin A1c of 6.3%. He has a family history of diabetes, with both his sister and brother affected. He is concerned about his weight gain and its potential impact on his health, noting that he is 'two away from being diabetic'.  He describes a busy lifestyle, working as a runner, broadcasting/film/video and for [employer], which involves significant physical activity. He has attempted intermittent fasting but finds it challenging due to his work schedule, particularly when he finishes work late at night and feels hungry after burning calories.  He experiences chronic constipation and relies on laxatives to manage it. He admits to inadequate water and fiber intake, which he recognizes as contributing factors. He has a history of colonoscopy.  He reports shoulder pain related to his work at [employer], involving lifting and carrying boxes. He recalls having an ultrasound and x-rays done years ago, which showed multiple tears, but he has not had recent imaging.  He experiences sinus issues, particularly with weather changes, and feels like he has a cold due to sinus drainage. He has used Nasonex  in the past but not consistently. He does not take Zyrtec  daily but  acknowledges sinus problems are persistent.     OBJECTIVE:       05/21/2024    2:43 PM  Depression screen PHQ 2/9  Decreased Interest 0  Down, Depressed, Hopeless 0  PHQ - 2 Score 0     BP Readings from Last 3 Encounters:  05/21/24 132/88  04/19/24 130/88  08/05/23 118/84    BP 132/88   Pulse (!) 50   Wt 218 lb 12.8 oz (99.2 kg)   SpO2 99%   BMI 33.27 kg/m    Physical Exam    Physical Exam Constitutional:      Appearance: Normal appearance.  Neurological:     General: No focal deficit present.     Mental Status: He is alert and oriented to person, place, and time. Mental status is at baseline.     ASSESSMENT/PLAN:   Assessment & Plan Annual physical exam  Prediabetes  Hypercholesteremia  Obesity (BMI 30-39.9)  Acute pain of right shoulder    Assessment and Plan Assessment & Plan Adult Wellness Visit Routine wellness visit with improved blood pressure readings. Discussed weight management and prediabetes. - Ordered basic labs. - Discussed weight management strategies.  Primary hypertension Blood pressure is borderline but improved. Weight loss is anticipated to aid in blood pressure control. - Adjust antihypertensive regimen as discussed. - Focus on weight loss to aid in blood pressure control.  Obesity and prediabetes Obesity with prediabetes (HbA1c 6.3). Discussed GLP-1 agonist for weight loss, emphasizing its role in reducing caloric  intake and aiding weight loss. Addressed concerns about long-term side effects and highlighted the importance of lifestyle changes post-medication. -Patient with a diagnosis of prediabetes and obesity. Despite lifestyle modification, dietary changes, and regular exercise, weight loss and glycemic control remain suboptimal. Initiation of a GLP-1 receptor agonist is medically indicated for improvement of glycemic control, weight reduction, and cardiometabolic risk reduction in accordance with current ADA and AACE  guidelines. Given the presence of obesity and prediabetes associated cardiovascular/metabolic risk, a GLP-1 receptor agonist is clinically appropriate. Medication choice discussed with the patient, including benefits, potential adverse effects, and administration instructions. Plan to initiate therapy pending insurance approval. - Ordered GLP-1 agonist for weight loss, pending insurance approval.. - Encouraged regular exercise and lifestyle modifications.  Chronic constipation Likely due to inadequate fiber and water intake. Emphasized the importance of increasing fiber and water intake before considering laxatives. - Increase fiber intake through diet and supplements like Metamucil. - Increase water intake to 60-70 ounces per day. - Avoid laxatives unless dietary changes are ineffective.  Allergic rhinitis Symptoms exacerbated by weather changes. Explained that Flonase  requires consistent use for two weeks to be effective and that Zyrtec  should be taken daily to manage histamine response. - Use Flonase  daily for two weeks, then continue as needed. - Take Zyrtec  daily for symptom control. - Will reassess symptoms in three weeks.     Frank Vargas A. Vita MD [facility]

## 2024-05-22 ENCOUNTER — Ambulatory Visit: Payer: Self-pay | Admitting: Family Medicine

## 2024-05-22 DIAGNOSIS — E119 Type 2 diabetes mellitus without complications: Secondary | ICD-10-CM

## 2024-05-22 LAB — COMPREHENSIVE METABOLIC PANEL WITH GFR
ALT: 43 IU/L (ref 0–44)
AST: 30 IU/L (ref 0–40)
Albumin: 4.3 g/dL (ref 3.8–4.9)
Alkaline Phosphatase: 70 IU/L (ref 47–123)
BUN/Creatinine Ratio: 13 (ref 9–20)
BUN: 15 mg/dL (ref 6–24)
Bilirubin Total: 0.3 mg/dL (ref 0.0–1.2)
CO2: 24 mmol/L (ref 20–29)
Calcium: 9.5 mg/dL (ref 8.7–10.2)
Chloride: 101 mmol/L (ref 96–106)
Creatinine, Ser: 1.19 mg/dL (ref 0.76–1.27)
Globulin, Total: 2.7 g/dL (ref 1.5–4.5)
Glucose: 86 mg/dL (ref 70–99)
Potassium: 4.2 mmol/L (ref 3.5–5.2)
Sodium: 140 mmol/L (ref 134–144)
Total Protein: 7 g/dL (ref 6.0–8.5)
eGFR: 74 mL/min/1.73

## 2024-05-22 LAB — CBC WITH DIFFERENTIAL/PLATELET
Basophils Absolute: 0.1 x10E3/uL (ref 0.0–0.2)
Basos: 1 %
EOS (ABSOLUTE): 0.4 x10E3/uL (ref 0.0–0.4)
Eos: 7 %
Hematocrit: 46.4 % (ref 37.5–51.0)
Hemoglobin: 15 g/dL (ref 13.0–17.7)
Immature Grans (Abs): 0 x10E3/uL (ref 0.0–0.1)
Immature Granulocytes: 0 %
Lymphocytes Absolute: 2.3 x10E3/uL (ref 0.7–3.1)
Lymphs: 35 %
MCH: 29.3 pg (ref 26.6–33.0)
MCHC: 32.3 g/dL (ref 31.5–35.7)
MCV: 91 fL (ref 79–97)
Monocytes Absolute: 1 x10E3/uL — ABNORMAL HIGH (ref 0.1–0.9)
Monocytes: 16 %
Neutrophils Absolute: 2.7 x10E3/uL (ref 1.4–7.0)
Neutrophils: 41 %
Platelets: 273 x10E3/uL (ref 150–450)
RBC: 5.12 x10E6/uL (ref 4.14–5.80)
RDW: 13.6 % (ref 11.6–15.4)
WBC: 6.5 x10E3/uL (ref 3.4–10.8)

## 2024-05-22 LAB — LIPID PANEL
Chol/HDL Ratio: 2.6 ratio (ref 0.0–5.0)
Cholesterol, Total: 158 mg/dL (ref 100–199)
HDL: 60 mg/dL (ref >39)
LDL Chol Calc (NIH): 80 mg/dL (ref 0–99)
Triglycerides: 101 mg/dL (ref 0–149)
VLDL Cholesterol Cal: 18 mg/dL (ref 5–40)

## 2024-05-22 MED ORDER — TIRZEPATIDE 2.5 MG/0.5ML ~~LOC~~ SOAJ: 2.5000 mg | SUBCUTANEOUS | 0 refills | Status: DC

## 2024-05-22 NOTE — Addendum Note (Signed)
 Addended by: Jefferie Holston on: 05/22/2024 10:22 AM   Modules accepted: Orders

## 2024-06-12 MED ORDER — TIRZEPATIDE 5 MG/0.5ML ~~LOC~~ SOAJ
5.0000 mg | SUBCUTANEOUS | 0 refills | Status: AC
Start: 2024-06-12 — End: ?

## 2024-07-09 MED ORDER — TIRZEPATIDE 7.5 MG/0.5ML ~~LOC~~ SOAJ: 7.5000 mg | SUBCUTANEOUS | 2 refills | Status: AC

## 2024-07-09 NOTE — Addendum Note (Signed)
 Addended by: Mailee Klaas on: 07/09/2024 01:51 PM   Modules accepted: Orders

## 2024-07-10 ENCOUNTER — Other Ambulatory Visit (HOSPITAL_COMMUNITY): Payer: Self-pay

## 2024-07-10 ENCOUNTER — Telehealth: Payer: Self-pay | Admitting: Pharmacy Technician

## 2024-07-10 NOTE — Telephone Encounter (Signed)
 Ozempic/Mounjaro  is approved exclusively as an adjunct to diet and exercise to improve glycemic  control in adults with type 2 diabetes mellitus. A review of patient's medical chart reveals no  documented diagnosis of type 2 diabetes or an A1C indicative of diabetes. Therefore, they do not  currently meet the criteria for prior authorization of this medication. If clinically appropriate, alternative  options such as Saxenda, Zepbound , or Georjean may be considered for this patient.   **We need a documented lab result showing his A1C is 6.5 or higher in order to submit a PA for mounjaro .**  Thanks!

## 2024-07-11 ENCOUNTER — Other Ambulatory Visit (HOSPITAL_COMMUNITY): Payer: Self-pay

## 2024-07-11 NOTE — Telephone Encounter (Signed)
 Good morning, not sure how he's been getting the medication but PA is required through his insurance and we don't send without A1C of 6.5 or higher because it will get denied.

## 2024-07-13 ENCOUNTER — Other Ambulatory Visit (HOSPITAL_COMMUNITY): Payer: Self-pay

## 2024-07-30 ENCOUNTER — Other Ambulatory Visit: Payer: Self-pay | Admitting: Family Medicine

## 2024-07-30 DIAGNOSIS — R1319 Other dysphagia: Secondary | ICD-10-CM

## 2024-08-21 ENCOUNTER — Other Ambulatory Visit: Payer: Self-pay | Admitting: Family Medicine

## 2024-08-21 DIAGNOSIS — E785 Hyperlipidemia, unspecified: Secondary | ICD-10-CM
